# Patient Record
Sex: Female | Born: 1955
Health system: Southern US, Community
[De-identification: ages and names within clinical notes are randomized; demographics above are authoritative.]

## PROBLEM LIST (undated history)

## (undated) DIAGNOSIS — M1990 Unspecified osteoarthritis, unspecified site: Secondary | ICD-10-CM

## (undated) DIAGNOSIS — I872 Venous insufficiency (chronic) (peripheral): Secondary | ICD-10-CM

## (undated) DIAGNOSIS — R42 Dizziness and giddiness: Secondary | ICD-10-CM

## (undated) DIAGNOSIS — J40 Bronchitis, not specified as acute or chronic: Secondary | ICD-10-CM

## (undated) DIAGNOSIS — E785 Hyperlipidemia, unspecified: Secondary | ICD-10-CM

## (undated) DIAGNOSIS — I10 Essential (primary) hypertension: Secondary | ICD-10-CM

## (undated) HISTORY — DX: Venous insufficiency (chronic) (peripheral): I87.2

## (undated) HISTORY — PX: CATARACT EXTRACTION: SUR2

## (undated) HISTORY — DX: Hyperlipidemia, unspecified: E78.5

## (undated) HISTORY — DX: Dizziness and giddiness: R42

## (undated) HISTORY — PX: COLONOSCOPY: SHX174

---

## 1979-08-01 HISTORY — PX: TUBAL LIGATION: SHX77

## 1985-07-31 HISTORY — PX: PARTIAL HYSTERECTOMY: SHX80

## 1999-12-04 ENCOUNTER — Encounter: Payer: Self-pay | Admitting: Emergency Medicine

## 1999-12-04 ENCOUNTER — Emergency Department (HOSPITAL_COMMUNITY): Admission: EM | Admit: 1999-12-04 | Discharge: 1999-12-04 | Payer: Self-pay | Admitting: Emergency Medicine

## 2000-06-06 ENCOUNTER — Encounter: Payer: Self-pay | Admitting: Obstetrics and Gynecology

## 2000-06-06 ENCOUNTER — Encounter: Admission: RE | Admit: 2000-06-06 | Discharge: 2000-06-06 | Payer: Self-pay | Admitting: Obstetrics and Gynecology

## 2000-06-08 ENCOUNTER — Encounter: Payer: Self-pay | Admitting: Obstetrics and Gynecology

## 2000-06-08 ENCOUNTER — Encounter: Admission: RE | Admit: 2000-06-08 | Discharge: 2000-06-08 | Payer: Self-pay | Admitting: Obstetrics and Gynecology

## 2001-06-03 ENCOUNTER — Other Ambulatory Visit: Admission: RE | Admit: 2001-06-03 | Discharge: 2001-06-03 | Payer: Self-pay | Admitting: Obstetrics and Gynecology

## 2001-06-17 ENCOUNTER — Encounter: Payer: Self-pay | Admitting: Obstetrics and Gynecology

## 2001-06-17 ENCOUNTER — Encounter: Admission: RE | Admit: 2001-06-17 | Discharge: 2001-06-17 | Payer: Self-pay | Admitting: Obstetrics and Gynecology

## 2002-06-12 ENCOUNTER — Other Ambulatory Visit: Admission: RE | Admit: 2002-06-12 | Discharge: 2002-06-12 | Payer: Self-pay | Admitting: Obstetrics and Gynecology

## 2002-06-18 ENCOUNTER — Encounter: Admission: RE | Admit: 2002-06-18 | Discharge: 2002-06-18 | Payer: Self-pay | Admitting: Obstetrics and Gynecology

## 2002-06-18 ENCOUNTER — Encounter: Payer: Self-pay | Admitting: Obstetrics and Gynecology

## 2003-06-22 ENCOUNTER — Encounter: Admission: RE | Admit: 2003-06-22 | Discharge: 2003-06-22 | Payer: Self-pay | Admitting: Obstetrics and Gynecology

## 2004-01-05 ENCOUNTER — Emergency Department (HOSPITAL_COMMUNITY): Admission: EM | Admit: 2004-01-05 | Discharge: 2004-01-05 | Payer: Self-pay | Admitting: Emergency Medicine

## 2004-07-01 ENCOUNTER — Other Ambulatory Visit: Admission: RE | Admit: 2004-07-01 | Discharge: 2004-07-01 | Payer: Self-pay | Admitting: Obstetrics and Gynecology

## 2004-07-01 ENCOUNTER — Encounter: Admission: RE | Admit: 2004-07-01 | Discharge: 2004-07-01 | Payer: Self-pay | Admitting: Obstetrics and Gynecology

## 2005-07-03 ENCOUNTER — Encounter: Admission: RE | Admit: 2005-07-03 | Discharge: 2005-07-03 | Payer: Self-pay | Admitting: Obstetrics and Gynecology

## 2005-07-19 ENCOUNTER — Encounter: Admission: RE | Admit: 2005-07-19 | Discharge: 2005-07-19 | Payer: Self-pay | Admitting: Obstetrics and Gynecology

## 2005-11-28 IMAGING — CR DG ANKLE COMPLETE 3+V*R*
3 series · 3 of 3 positions shown · non-contrast
Comparison: none

CLINICAL DATA: Injury with pain.

[view not recorded (1 of 3)]
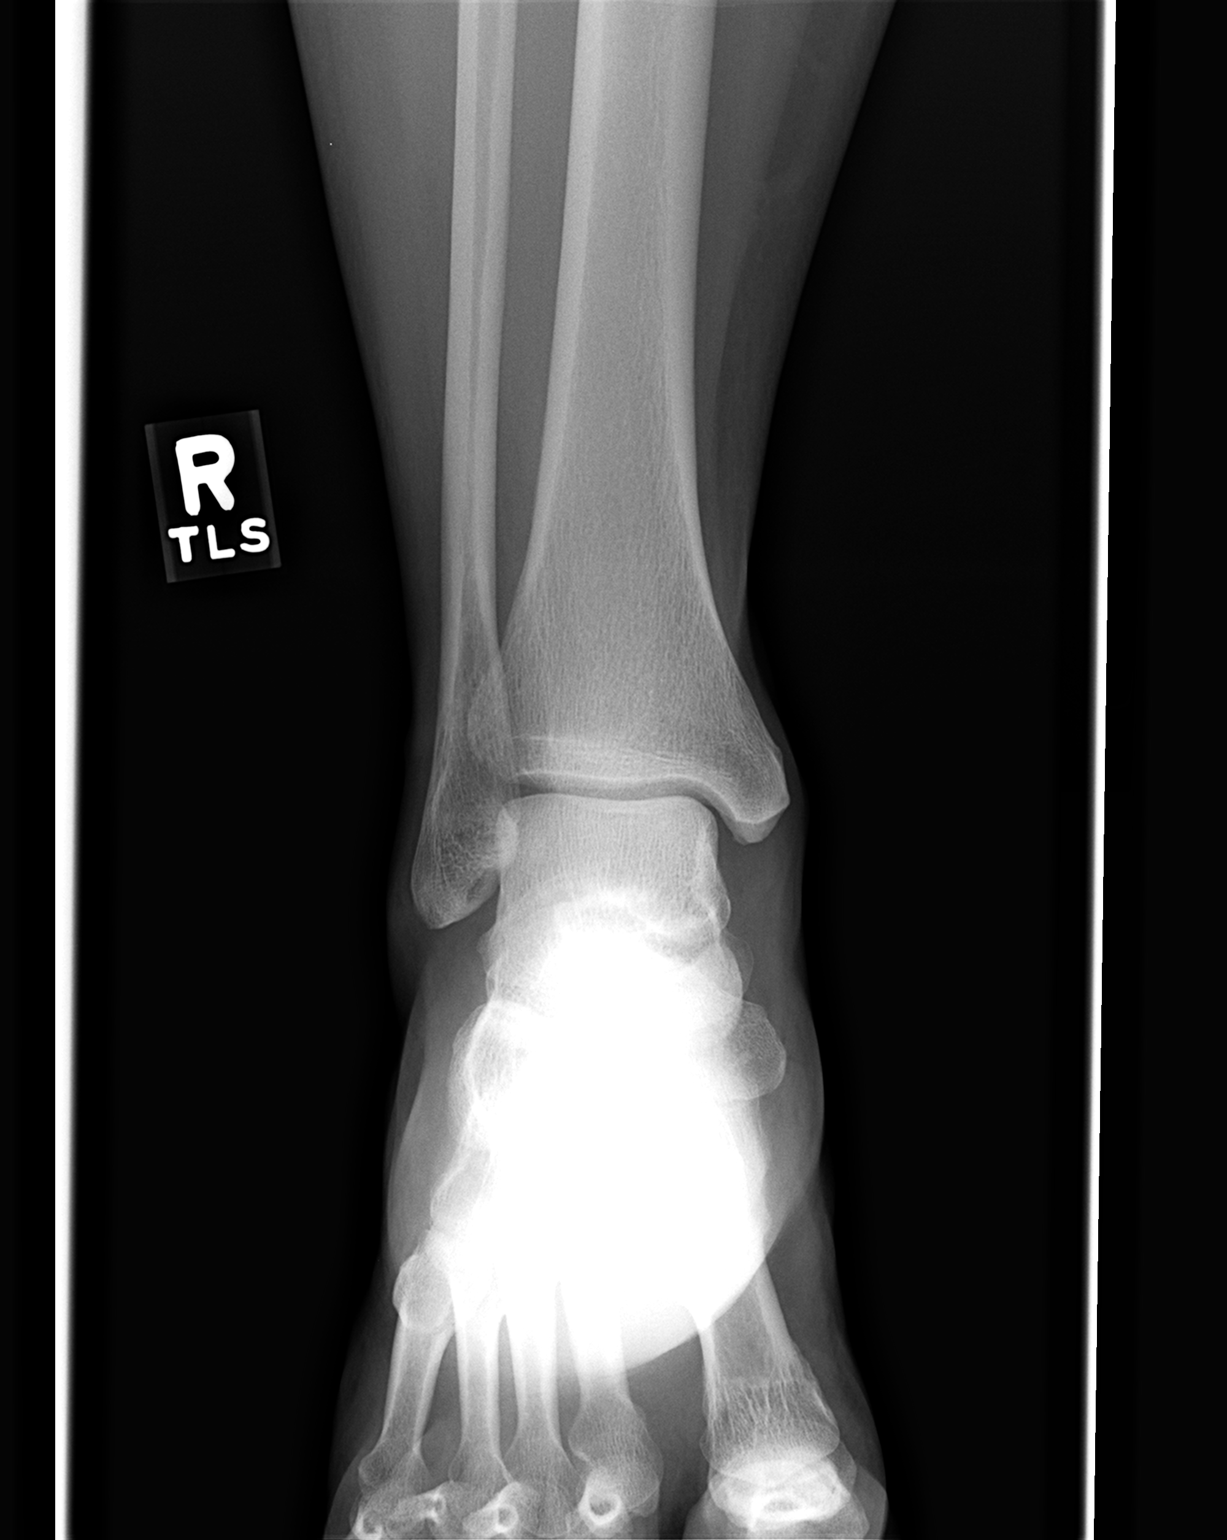

[view not recorded (2 of 3)]
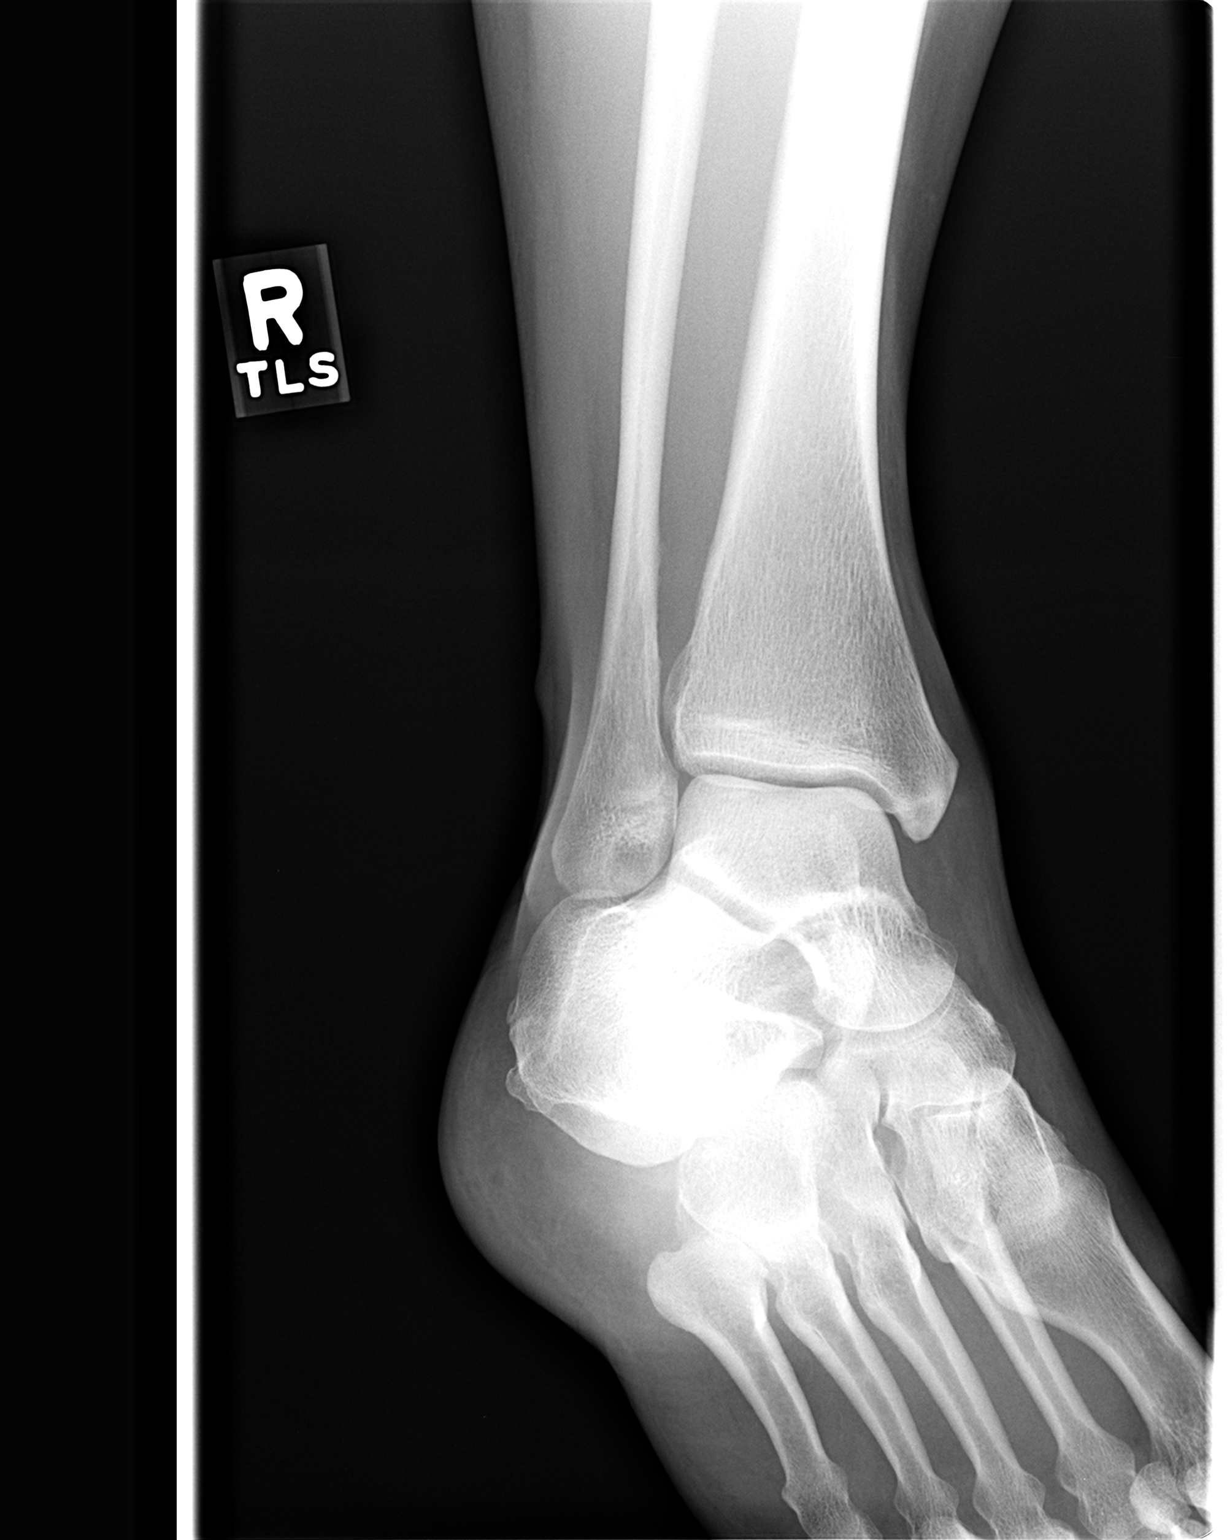

[view not recorded (3 of 3)]
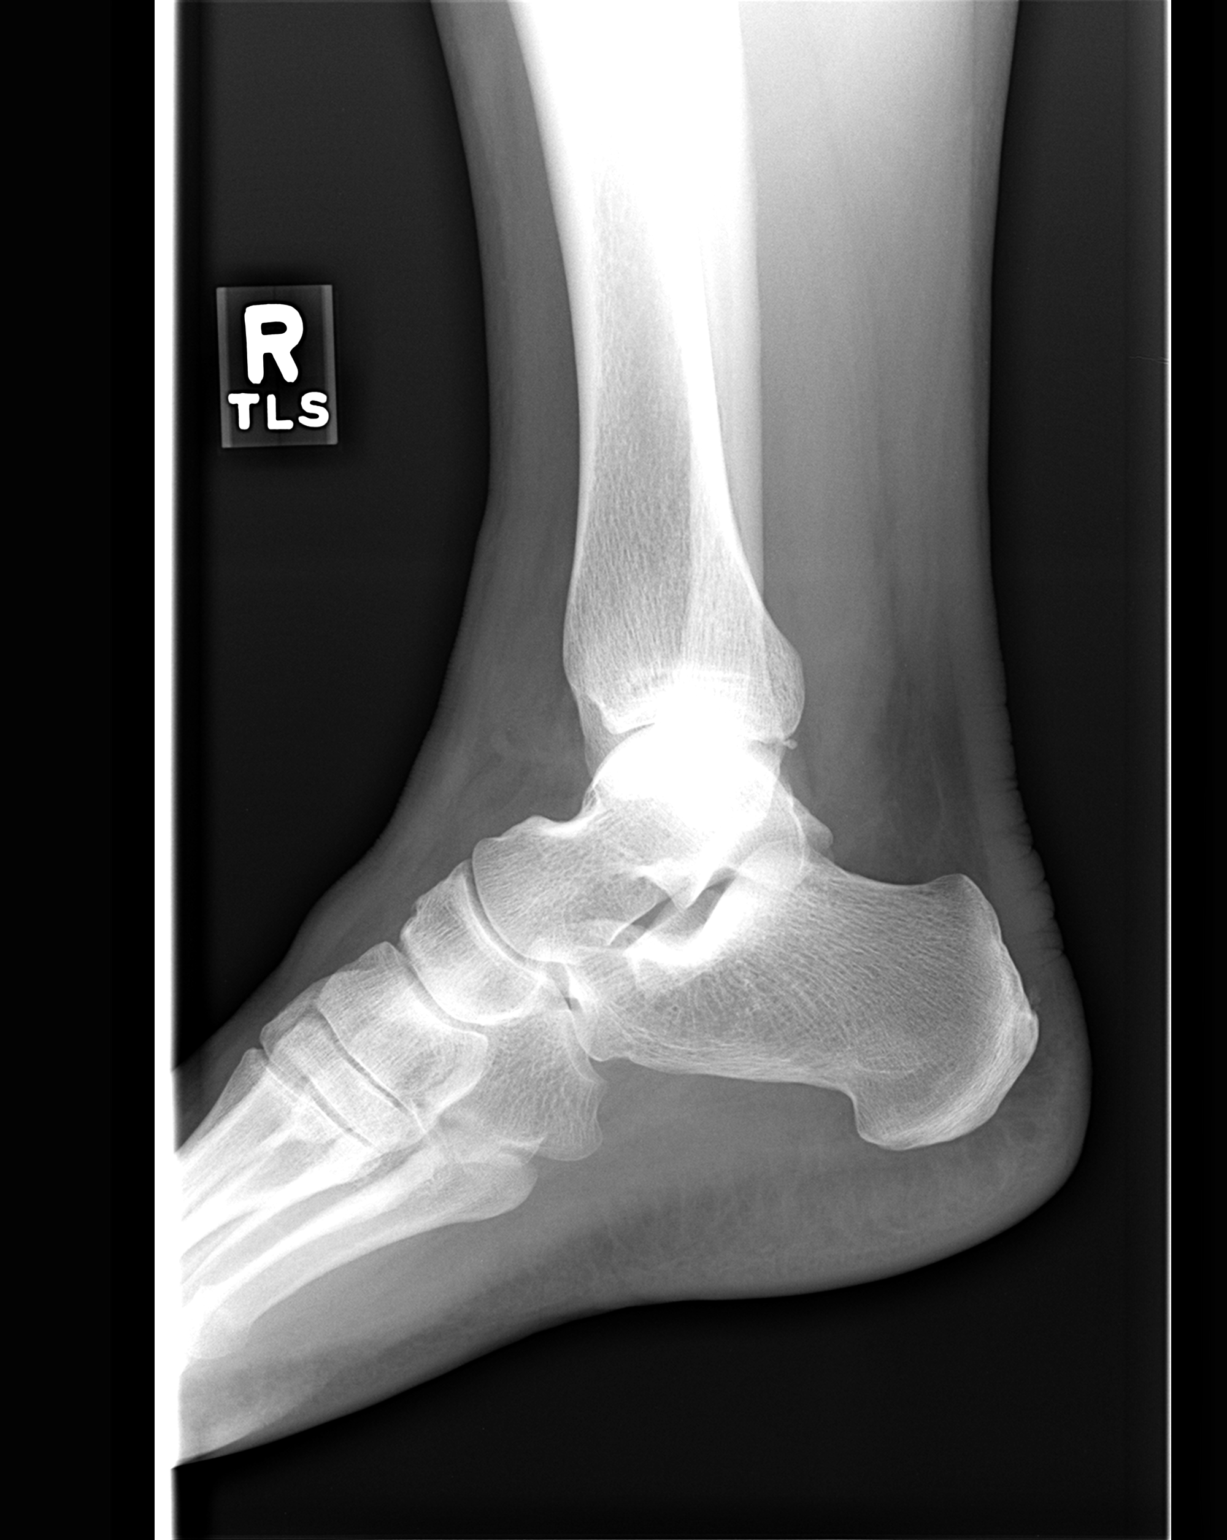

[3 of 3 positions shown; findings below may reference images not displayed]

TWO VIEW RIGHT FOOT, 01/05/04 
 There is no evidence for fracture or dislocation.  No other significant bone or soft tissue abnormalities are identified.  The joints spaces are within normal limits. 

 IMPRESSION
 Normal study.

 THREE VIEW RIGHT ANKLE, 01/05/04
 There is no evidence of fracture or dislocation.  No other significant bone or soft tissue abnormalities are identified. 

 IMPRESSION
 Normal study. 

 [REDACTED]

## 2005-11-28 IMAGING — CR DG FOOT COMPLETE 3+V*R*
1 series · 1 of 1 positions shown · non-contrast
Comparison: none

CLINICAL DATA: Injury with pain.

[view not recorded]
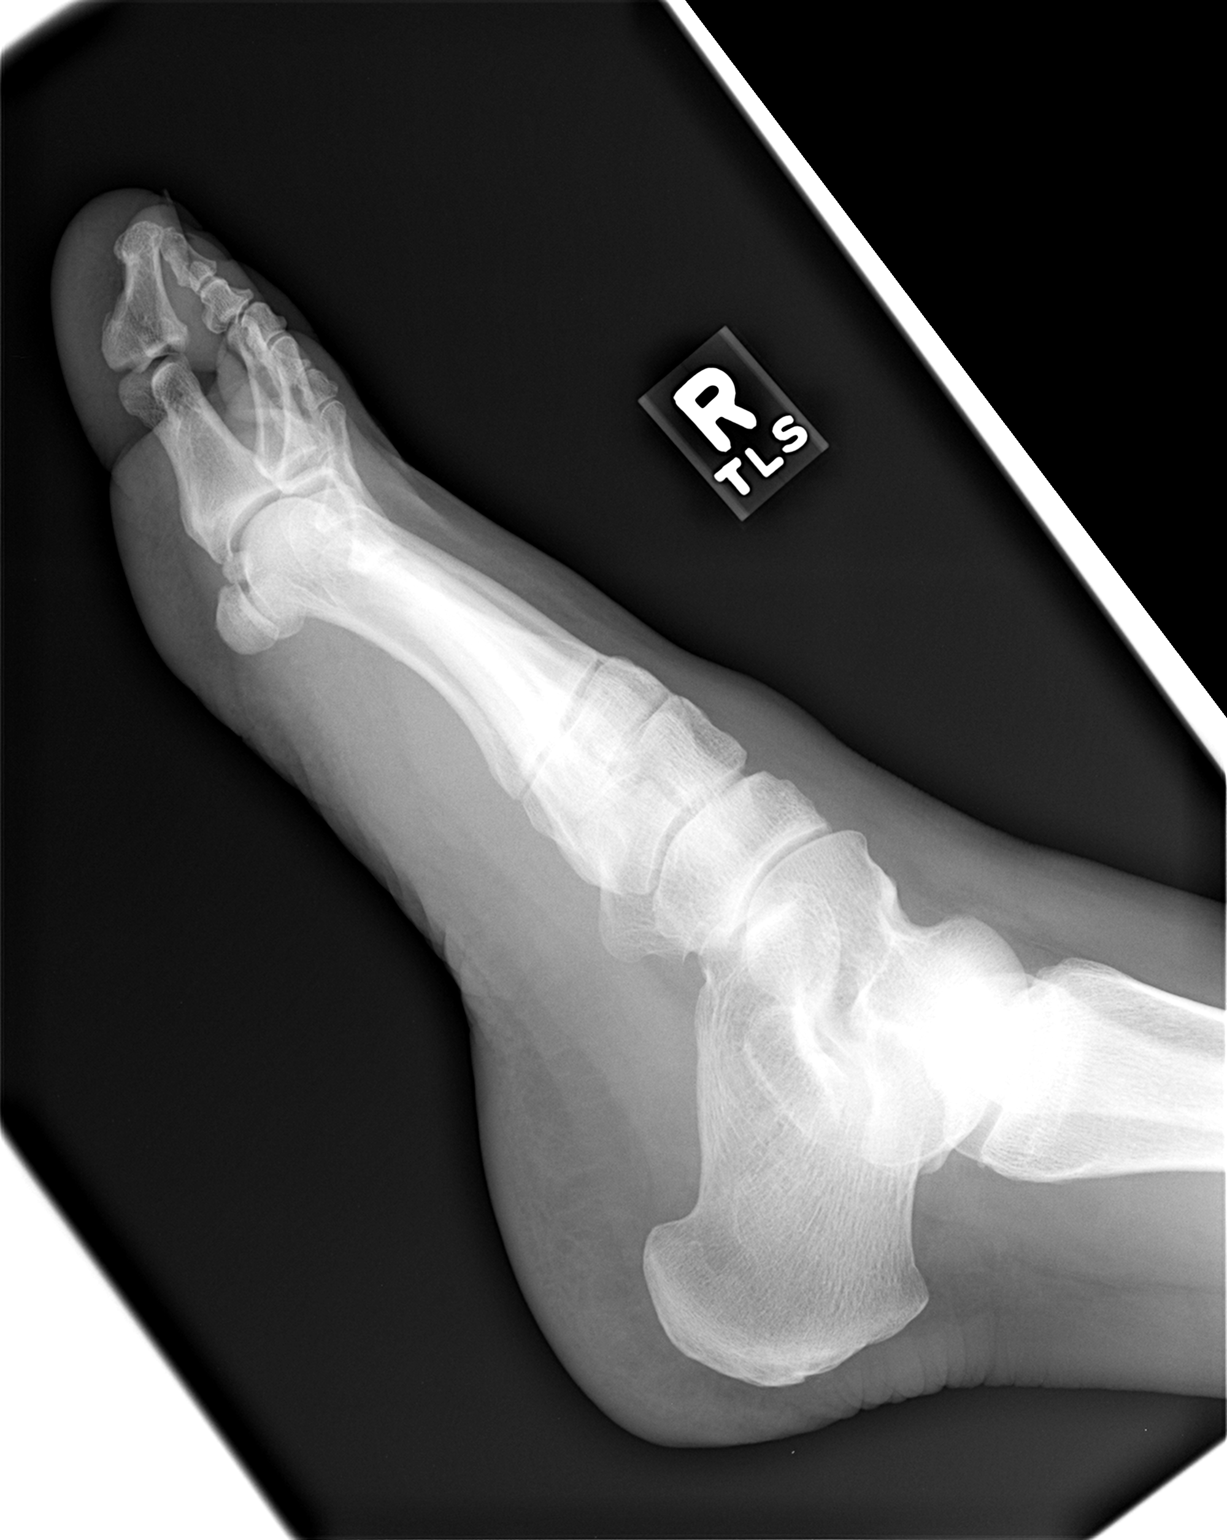

[1 of 1 positions shown; findings below may reference images not displayed]

TWO VIEW RIGHT FOOT, 01/05/04 
 There is no evidence for fracture or dislocation.  No other significant bone or soft tissue abnormalities are identified.  The joints spaces are within normal limits. 

 IMPRESSION
 Normal study.

 THREE VIEW RIGHT ANKLE, 01/05/04
 There is no evidence of fracture or dislocation.  No other significant bone or soft tissue abnormalities are identified. 

 IMPRESSION
 Normal study. 

 [REDACTED]

## 2006-07-18 ENCOUNTER — Encounter: Admission: RE | Admit: 2006-07-18 | Discharge: 2006-07-18 | Payer: Self-pay | Admitting: Obstetrics and Gynecology

## 2006-10-09 ENCOUNTER — Ambulatory Visit: Payer: Self-pay | Admitting: Gastroenterology

## 2006-10-23 ENCOUNTER — Ambulatory Visit: Payer: Self-pay | Admitting: Gastroenterology

## 2007-07-18 ENCOUNTER — Encounter: Admission: RE | Admit: 2007-07-18 | Discharge: 2007-07-18 | Payer: Self-pay | Admitting: Obstetrics and Gynecology

## 2008-06-30 ENCOUNTER — Encounter: Admission: RE | Admit: 2008-06-30 | Discharge: 2008-06-30 | Payer: Self-pay | Admitting: Obstetrics and Gynecology

## 2009-07-16 ENCOUNTER — Encounter: Admission: RE | Admit: 2009-07-16 | Discharge: 2009-07-16 | Payer: Self-pay | Admitting: Obstetrics and Gynecology

## 2010-04-22 ENCOUNTER — Encounter: Payer: Self-pay | Admitting: Nurse Practitioner

## 2010-04-25 ENCOUNTER — Encounter: Payer: Self-pay | Admitting: Nurse Practitioner

## 2010-04-27 ENCOUNTER — Encounter: Payer: Self-pay | Admitting: Nurse Practitioner

## 2010-04-27 ENCOUNTER — Encounter: Admission: RE | Admit: 2010-04-27 | Discharge: 2010-04-27 | Payer: Self-pay | Admitting: Internal Medicine

## 2010-05-09 ENCOUNTER — Telehealth: Payer: Self-pay | Admitting: Gastroenterology

## 2010-05-09 ENCOUNTER — Encounter: Payer: Self-pay | Admitting: Nurse Practitioner

## 2010-05-10 ENCOUNTER — Encounter (INDEPENDENT_AMBULATORY_CARE_PROVIDER_SITE_OTHER): Payer: Self-pay | Admitting: *Deleted

## 2010-05-12 ENCOUNTER — Encounter: Payer: Self-pay | Admitting: Gastroenterology

## 2010-05-12 ENCOUNTER — Ambulatory Visit: Payer: Self-pay | Admitting: Internal Medicine

## 2010-05-12 DIAGNOSIS — R1012 Left upper quadrant pain: Secondary | ICD-10-CM | POA: Insufficient documentation

## 2010-05-18 ENCOUNTER — Telehealth: Payer: Self-pay | Admitting: Gastroenterology

## 2010-07-11 ENCOUNTER — Encounter
Admission: RE | Admit: 2010-07-11 | Discharge: 2010-07-11 | Payer: Self-pay | Source: Home / Self Care | Attending: Obstetrics and Gynecology | Admitting: Obstetrics and Gynecology

## 2010-08-20 ENCOUNTER — Encounter: Payer: Self-pay | Admitting: Obstetrics and Gynecology

## 2010-08-30 NOTE — Letter (Signed)
Summary: New Patient letter  [facility]  [address] [place], [place] [address]   Phone: [phone]  Fax: [phone]       05/10/2010 MRN: [id number]  [facility] [address], [place]  [address]  Dear [facility],  Welcome to the Gastroenterology Division at [facility].    You are scheduled to see Willette Cluster, NP  on  05/12/2010  at 8:30 A.M.  on the 3rd floor at [facility] [address]  We ask that you try to arrive at our office 15 minutes prior to your appointment time to allow for check-in.  We would like you to complete the enclosed self-administered evaluation form prior to your visit and bring it with you on the day of your appointment.  We will review it with you.  Also, please bring a complete list of all your medications or, if you prefer, bring the medication bottles and we will list them.  Please bring your insurance card so that we may make a copy of it.  If your insurance requires a referral to see a specialist, please bring your referral form from your primary care physician.  Co-payments are due at the time of your visit and may be paid by cash, check or credit card.     Your office visit will consist of a consult with your physician (includes a physical exam), any laboratory testing he/she may order, scheduling of any necessary diagnostic testing (e.g. x-ray, ultrasound, CT-scan), and scheduling of a procedure (e.g. Endoscopy, Colonoscopy) if required.  Please allow enough time on your schedule to allow for any/all of these possibilities.    If you cannot keep your appointment, please call [phone] to cancel or reschedule prior to your appointment date.  This allows [place] the opportunity to schedule an appointment for another patient in need of care.  If you do not cancel or reschedule by 5 p.m. the business day prior to your appointment date, you will be charged a $50.00 late cancellation/no-show fee.    Thank you for  choosing  Gastroenterology for your medical needs.  We appreciate the opportunity to care for you.  Please visit [place] at our website  to learn more about our practice.                     Sincerely,                                                             The Gastroenterology Division

## 2010-08-30 NOTE — Letter (Signed)
Summary: Phone note/[facility]  Phone note/[facility]   Imported By: Lester [place] 05/25/2010 10:17:16  _____________________________________________________________________  External Attachment:    Type:   Image     Comment:   External Document

## 2010-08-30 NOTE — Letter (Signed)
Summary: EGD Instructions  [facility]  [address] [place], [place] [address]   Phone: [phone]  Fax: [phone]       CARSYN BOSTER    Dec 25, 1955    MRN: [id number]       Procedure Day /Date: 05-24-10     Arrival Time:  2:30 PM      Procedure Time:  3:30 PM     Location of Procedure:                    X     [facility] (4th Floor)   PREPARATION FOR ENDOSCOPY   On 05-24-10 THE DAY OF THE PROCEDURE:  1.   No solid foods, milk or milk products are allowed after midnight the night before your procedure.  2.   Do not drink anything colored red or purple.  Avoid juices with pulp.  No orange juice.  3.  You may drink clear liquids until 1:30 PM , which is 2 hours before your procedure.                                                                                                CLEAR LIQUIDS INCLUDE: Water Jello Ice Popsicles Tea (sugar ok, no milk/cream) Powdered fruit flavored drinks Coffee (sugar ok, no milk/cream) Gatorade Juice: apple, white grape, white cranberry  Lemonade Clear bullion, consomm, broth Carbonated beverages (any kind) Strained chicken noodle soup Hard Candy   MEDICATION INSTRUCTIONS  Unless otherwise instructed, you should take regular prescription medications with a small sip of water as early as possible the morning of your procedure.         OTHER INSTRUCTIONS  You will need a responsible adult at least 55 years of age to accompany you and drive you home.   This person must remain in the waiting room during your procedure.  Wear loose fitting clothing that is easily removed.  Leave jewelry and other valuables at home.  However, you may wish to bring a book to read or an iPod/MP3 player to listen to music as you wait for your procedure to start.  Remove all body piercing jewelry and leave at home.  Total time from sign-in until discharge is approximately 2-3 hours.  You should go home directly after  your procedure and rest.  You can resume normal activities the day after your procedure.  The day of your procedure you should not:   Drive   Make legal decisions   Operate machinery   Drink alcohol   Return to work  You will receive specific instructions about eating, activities and medications before you leave.    The above instructions have been reviewed and explained to me by   _______________________    I fully understand and can verbalize these instructions _____________________________ Date _________

## 2010-08-30 NOTE — Progress Notes (Signed)
Summary: Triage  Phone Note From Other Clinic   Caller: [facility]  [phone] Call For: Dr. Arlyce Dice Summary of Call: Left mid abd pain...CT scan done at [facility]. Requesting pt. be seen in the next week Initial call taken by: Karna Christmas,  May 09, 2010 4:59 PM  Follow-up for Phone Call        Patient  is scheduled to see Willette Cluster RNP 05/12/10 8:30 Follow-up by: Darcey Nora RN, CGRN,  May 10, 2010 9:54 AM

## 2010-08-30 NOTE — Procedures (Signed)
Summary: [facility] COLON   Colonoscopy  Procedure date:  10/23/2006  Findings:      Location:  [facility].    Procedures Next Due Date:    Colonoscopy: 10/2016 Patient Name: Tara Baker, Tara Baker. MRN:  Procedure Procedures: Colonoscopy CPT: [phone].  Personnel: Endoscopist: Barbette Hair. Arlyce Dice, MD.  Patient Consent: Procedure, Alternatives, Risks and Benefits discussed, consent obtained, from patient.  Indications  Average Risk Screening Routine.  History  Current Medications: Patient is not currently taking Coumadin.  Pre-Exam Physical: Performed Oct 23, 2006. Cardio-pulmonary exam, HEENT exam , Abdominal exam, Mental status exam WNL.  Comments: Patient history reviewed/updated, physical performed prior to initiation of sedation?yes Exam Exam: Extent of exam reached: Cecum, extent intended: Cecum.  The cecum was identified by appendiceal orifice and IC valve. Time to Cecum: 00:03: 45. Time for Withdrawl: 00:03:56. Colon retroflexion performed. ASA Classification: I. Tolerance: good.  Monitoring: Pulse and BP monitoring, Oximetry used. Supplemental O2 given. at 2 Liters.  Colon Prep Used Miralax for colon prep. Prep results: fair, adequate exam.  Sedation Meds: Patient assessed and found to be appropriate for moderate (conscious) sedation. Sedation was managed by the Endoscopist. Fentanyl 75 mcg. given IV. Versed 8 mg. given IV.  Findings - NORMAL EXAM: Cecum to Rectum.  NORMAL EXAM: Cecum.   Assessment Normal examination.  Events  Unplanned Interventions: No intervention was required.  Unplanned Events: There were no complications. Plans  Post Exam Instructions: Post sedation instructions given.  Patient Education: Patient given standard instructions for: a normal exam.  Disposition: After procedure patient sent to recovery. After recovery patient sent home.  Scheduling/Referral: Colonoscopy, to Barbette Hair. Arlyce Dice, MD, around Oct 22, 2016.     cc: Huel Cote, MD  This report was created from the original endoscopy report, which was reviewed and signed by the above listed endoscopist.

## 2010-08-30 NOTE — Progress Notes (Signed)
Summary: EGD ?'s  Phone Note Call from Patient Call back at Work Phone [phone]   Caller: Patient Call For: Dr. Arlyce Dice Reason for Call: Talk to Nurse Summary of Call: EGD ?'s Initial call taken by: Vallarie Mare,  May 18, 2010 9:43 AM  Follow-up for Phone Call        The pt called to report she is not having any issues like she was having when she was here and saw Willette Cluster RNP.  She is feeling much better not having the LUQ pain now and she wants to cancel the Endoscopy. I did mention the problem she was having could reoccur bur she still wants to cancel the procedure.  Follow-up by: Joselyn Glassman,  May 18, 2010 10:10 AM  Additional Follow-up for Phone Call Additional follow up Details #1::        Okay, good, maybe the Flexeril helped and it was musculoskeletal pain.  She should come back / call if recurrent pain Additional Follow-up by: Willette Cluster NP,  May 19, 2010 4:30 PM

## 2010-08-30 NOTE — Letter (Signed)
Summary: [facility]  [facility]   Imported By: Lester [place] 05/25/2010 10:14:33  _____________________________________________________________________  External Attachment:    Type:   Image     Comment:   External Document

## 2010-08-30 NOTE — Assessment & Plan Note (Signed)
Summary: left sided abdominal pain/sheri   History of Present Illness Visit Type: consult Primary GI MD: Melvia Heaps MD [facility] Primary Fumio Vandam: Jarome Matin, MD Chief Complaint: Patient c/o several weeks LLQ abdominal pain which can be sharp in nature. She also has alternating constipation and diarrhea. History of Present Illness:   Patient had screening colonoscopy March 2008 (last time seen here, > 3 yrs ago) with Dr. Arlyce Dice a few years ago. She has been referred here for evaluation of abdominal pain and gives a several day history of non-radiating LUQ pain, intermittent nausea. Pain is constant, nagging with occasional sharpness. It is exacerbated by twisting and bending. No associated nausea. Dr. Jacky Kindle stopped her Meloxicam, started her on a PPI which she took for a few days then discontinued it because symptoms didn't improve. No unusual exercises / activities lately. Has "pinched nerve" in lower back so careful not to lift too much. Has intermittent pain over right buttock and down right leg. CTscan  Chronic problems with alternating constipation (hard stool) / loose stool. She has had this for years. No black stools.      GI Review of Systems    Reports abdominal pain and  nausea.     Location of  Abdominal pain: LUQ.    Denies acid reflux, belching, bloating, chest pain, dysphagia with liquids, dysphagia with solids, heartburn, loss of appetite, vomiting, vomiting blood, weight loss, and  weight gain.      Reports change in bowel habits, constipation, and  diarrhea.     Denies anal fissure, black tarry stools, diverticulosis, fecal incontinence, heme positive stool, hemorrhoids, irritable bowel syndrome, jaundice, light color stool, liver problems, rectal bleeding, and  rectal pain. Preventive Screening-Counseling & Management  Alcohol-Tobacco     Smoking Status: quit  Caffeine-Diet-Exercise     Does Patient Exercise: no      Drug Use:  no.      Current Medications  (verified): 1)  Aspirin 81 Mg Tbec (Aspirin) .... Take 1 Tablet By Mouth Once A Day 2)  Meloxicam 7.5 Mg Tabs (Meloxicam) .... Take 1 Tablet By Mouth Once A Day 3)  Lisinopril 10 Mg Tabs (Lisinopril) .... Take 1 Tablet By Mouth Once A Day 4)  Hydrocodone-Acetaminophen 5-325 Mg Tabs (Hydrocodone-Acetaminophen) .... Take 1 Tablet By Mouth As Needed For Back Pain  Allergies (verified): No Known Drug Allergies  Past History:  Past Medical History: Headaches Hypertension  Past Surgical History: Hysterectomy Tubal Ligation  Family History: Family History of Breast Cancer: Maternal Aunt No FH of Colon Cancer: Family History of Pancreatic Cancer: Maternal Grandfather Family History of Diabetes: Maternal Grandmother  Social History: Patient is a former smoker. -stopped 14 years ago Alcohol Use - yes-very rare2-6 cups daily Daily Caffeine Use- Illicit Drug Use - no Patient does not get regular exercise.  Smoking Status:  quit Drug Use:  no Does Patient Exercise:  no  Review of Systems       The patient complains of back pain and headaches-new.  The patient denies allergy/sinus, anemia, anxiety-new, arthritis/joint pain, blood in urine, breast changes/lumps, change in vision, confusion, cough, coughing up blood, depression-new, fainting, fatigue, fever, hearing problems, heart murmur, heart rhythm changes, itching, menstrual pain, muscle pains/cramps, night sweats, nosebleeds, pregnancy symptoms, shortness of breath, skin rash, sleeping problems, sore throat, swelling of feet/legs, swollen lymph glands, thirst - excessive , urination - excessive , urination changes/pain, urine leakage, vision changes, and voice change.    Vital Signs:  Patient profile:   55  year old female Height:      65 inches Weight:      183.38 pounds BMI:     30.63 BSA:     1.91 Pulse rate:   88 / minute Pulse rhythm:   regular BP sitting:   140 / 80  (left arm)  Vitals Entered By: Lamona Curl CMA  Duncan Dull) (May 12, 2010 8:42 AM)  Physical Exam  General:  Well developed, well nourished, no acute distress. Head:  Normocephalic and atraumatic. Eyes:  Conjunctiva pink, no icterus.  Mouth:  No oral lesions. Tongue moist.  Neck:  Supple; no masses Lungs:  Clear throughout to auscultation. Heart:  Regular rate and rhythm; no murmurs, rubs,  or bruits. Abdomen:  .Abdomen soft, nondistended. Mild to moderate LUQ tenderness to palpation. Cannot reproduce pain by engaging upper abdominal muscles. No obvious masses or hepatomegaly.Normal bowel sounds. Rectal:  No external or internal lesions. Light brown, heme negative stool.  Msk:  Symmetrical with no gross deformities. Normal posture. Extremities:  No palmar erythema, no edema.  Neurologic:  Alert and  oriented x4;  grossly normal neurologically. Skin:  Intact without significant lesions or rashes. Cervical Nodes:  No significant cervical adenopathy. Psych:  Alert and cooperative. Normal mood and affect.   Impression & Recommendations:  Problem # 1:  LUQ PAIN (ICD-789.02) Assessment New Several day history of LUQ pain, intermittent nausea. CTscan with contrast negative except for fatty liver, fat containing ventral hernia in RLQ. A soft tissue density within the descending duodenum was seen but felt to be due to underdistension. Patient was on Meloxicam so PUD is possibility. Musculoskeletal pain is possible. We discussed upper endoscopy for further evaluation though she is a little anxious about having a procedure. Will try low dose Flexeril and heat to area for the next 3 or so days. Restart PPI and continue to hold Meloxicam. The patient will be scheduled for an EGD with biopsies (if indicated).  The risks and benefits of the procedure, as well as alternatives were discussed with the patient and she agrees to proceed. Obviously, if patient's symptoms resolve we can hold off on upper endoscopy.   Other Orders: EGD (EGD)  Patient  Instructions: 1)  Use the heating pad on the left upper abd area. 2)  Continue the PPI medication.  3)  We scheduled the Endoscopy for 05-24-10. 4)  Directions and brochure provided. 5)  [facility] Patient Information Guide given to patient. 6)  Call Pam on Tues of next week with an update.  Call [phone]. 7)  Copy sent to : [facility] Prescriptions: FLEXERIL 5 MG TABS (CYCLOBENZAPRINE HCL) Take 1 tab before bedtime Thurs and Fri,  then take 1 tab twice daily..  Will make you sleepy.  #30 x 0   Entered by:   Lowry Ram NCMA   Authorized by:   Willette Cluster NP   Signed by:   Lowry Ram NCMA on 05/12/2010   Method used:   Electronically to        [facility] [address] (retail)       [phone]       [place], [place]  [address]       Ph: [phone]       Fax: [phone]   RxID:   [id number]

## 2010-08-30 NOTE — Letter (Signed)
Summary: [facility]  [facility]   Imported By: Lester  05/25/2010 10:15:54  _____________________________________________________________________  External Attachment:    Type:   Image     Comment:   External Document

## 2011-05-11 ENCOUNTER — Other Ambulatory Visit: Payer: Self-pay | Admitting: Obstetrics and Gynecology

## 2011-05-11 DIAGNOSIS — Z1231 Encounter for screening mammogram for malignant neoplasm of breast: Secondary | ICD-10-CM

## 2011-07-13 ENCOUNTER — Ambulatory Visit
Admission: RE | Admit: 2011-07-13 | Discharge: 2011-07-13 | Disposition: A | Discharge status: Home / Self Care | Payer: 59 | Source: Ambulatory Visit | Attending: Obstetrics and Gynecology | Admitting: Obstetrics and Gynecology

## 2011-07-13 DIAGNOSIS — Z1231 Encounter for screening mammogram for malignant neoplasm of breast: Secondary | ICD-10-CM

## 2012-05-01 ENCOUNTER — Other Ambulatory Visit: Payer: Self-pay | Admitting: Obstetrics and Gynecology

## 2012-05-01 DIAGNOSIS — Z1231 Encounter for screening mammogram for malignant neoplasm of breast: Secondary | ICD-10-CM

## 2012-07-17 ENCOUNTER — Ambulatory Visit
Admission: RE | Admit: 2012-07-17 | Discharge: 2012-07-17 | Disposition: A | Discharge status: Home / Self Care | Payer: 59 | Source: Ambulatory Visit | Attending: Obstetrics and Gynecology | Admitting: Obstetrics and Gynecology

## 2012-07-17 DIAGNOSIS — Z1231 Encounter for screening mammogram for malignant neoplasm of breast: Secondary | ICD-10-CM

## 2013-05-07 ENCOUNTER — Other Ambulatory Visit: Payer: Self-pay

## 2013-05-07 DIAGNOSIS — Z1231 Encounter for screening mammogram for malignant neoplasm of breast: Secondary | ICD-10-CM

## 2013-05-08 ENCOUNTER — Other Ambulatory Visit: Payer: Self-pay | Admitting: Obstetrics and Gynecology

## 2013-05-16 ENCOUNTER — Other Ambulatory Visit: Payer: Self-pay | Admitting: Obstetrics and Gynecology

## 2013-05-16 DIAGNOSIS — Z780 Asymptomatic menopausal state: Secondary | ICD-10-CM

## 2013-06-24 ENCOUNTER — Other Ambulatory Visit: Payer: Self-pay | Admitting: Obstetrics and Gynecology

## 2013-07-01 ENCOUNTER — Ambulatory Visit
Admission: RE | Admit: 2013-07-01 | Discharge: 2013-07-01 | Disposition: A | Discharge status: Home / Self Care | Payer: 59 | Source: Ambulatory Visit | Attending: Obstetrics and Gynecology | Admitting: Obstetrics and Gynecology

## 2013-07-01 ENCOUNTER — Ambulatory Visit
Admission: RE | Admit: 2013-07-01 | Discharge: 2013-07-01 | Disposition: A | Discharge status: Home / Self Care | Payer: 59 | Source: Ambulatory Visit

## 2013-07-01 DIAGNOSIS — Z780 Asymptomatic menopausal state: Secondary | ICD-10-CM

## 2013-07-01 DIAGNOSIS — Z1231 Encounter for screening mammogram for malignant neoplasm of breast: Secondary | ICD-10-CM

## 2014-03-16 ENCOUNTER — Encounter (HOSPITAL_COMMUNITY): Payer: Self-pay | Admitting: Emergency Medicine

## 2014-03-16 ENCOUNTER — Emergency Department (HOSPITAL_COMMUNITY): Payer: 59

## 2014-03-16 ENCOUNTER — Emergency Department (HOSPITAL_COMMUNITY)
Admission: EM | Admit: 2014-03-16 | Discharge: 2014-03-16 | Disposition: A | Discharge status: Home / Self Care | Payer: 59 | Attending: Emergency Medicine | Admitting: Emergency Medicine

## 2014-03-16 DIAGNOSIS — Z7982 Long term (current) use of aspirin: Secondary | ICD-10-CM | POA: Insufficient documentation

## 2014-03-16 DIAGNOSIS — Z79899 Other long term (current) drug therapy: Secondary | ICD-10-CM | POA: Insufficient documentation

## 2014-03-16 DIAGNOSIS — Y9389 Activity, other specified: Secondary | ICD-10-CM | POA: Diagnosis not present

## 2014-03-16 DIAGNOSIS — S0990XA Unspecified injury of head, initial encounter: Secondary | ICD-10-CM | POA: Diagnosis not present

## 2014-03-16 DIAGNOSIS — S0993XA Unspecified injury of face, initial encounter: Secondary | ICD-10-CM | POA: Diagnosis present

## 2014-03-16 DIAGNOSIS — Y92410 Unspecified street and highway as the place of occurrence of the external cause: Secondary | ICD-10-CM | POA: Diagnosis not present

## 2014-03-16 DIAGNOSIS — I10 Essential (primary) hypertension: Secondary | ICD-10-CM | POA: Insufficient documentation

## 2014-03-16 DIAGNOSIS — S199XXA Unspecified injury of neck, initial encounter: Principal | ICD-10-CM

## 2014-03-16 HISTORY — DX: Essential (primary) hypertension: I10

## 2014-03-16 MED ORDER — OXYCODONE-ACETAMINOPHEN 5-325 MG PO TABS: 1.0000 | ORAL_TABLET | ORAL | Status: DC | PRN

## 2014-03-16 MED ORDER — OXYCODONE-ACETAMINOPHEN 5-325 MG PO TABS
1.0000 | ORAL_TABLET | Freq: Once | ORAL | Status: AC
  Administered 2014-03-16: 1 via ORAL
  Filled 2014-03-16: qty 1

## 2014-03-16 MED ORDER — METHOCARBAMOL 500 MG PO TABS: 500.0000 mg | ORAL_TABLET | Freq: Two times a day (BID) | ORAL | Status: DC
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
## 2014-03-18 NOTE — ED Provider Notes (Signed)
Medical screening examination/treatment/procedure(s) were performed by non-physician practitioner and as supervising physician I was immediately available for consultation/collaboration.   EKG Interpretation None        Pamella Pert, MD 03/18/14 1149

## 2014-08-12 ENCOUNTER — Encounter: Payer: Self-pay | Admitting: Gastroenterology

## 2016-07-12 DIAGNOSIS — Z87448 Personal history of other diseases of urinary system: Secondary | ICD-10-CM | POA: Insufficient documentation

## 2016-09-04 ENCOUNTER — Encounter: Payer: Self-pay | Admitting: Gastroenterology

## 2017-01-28 DIAGNOSIS — H524 Presbyopia: Secondary | ICD-10-CM | POA: Diagnosis not present

## 2017-02-06 DIAGNOSIS — I10 Essential (primary) hypertension: Secondary | ICD-10-CM | POA: Diagnosis not present

## 2017-02-06 DIAGNOSIS — Z780 Asymptomatic menopausal state: Secondary | ICD-10-CM | POA: Diagnosis not present

## 2017-02-06 DIAGNOSIS — Z0000 Encounter for general adult medical examination without abnormal findings: Secondary | ICD-10-CM | POA: Diagnosis not present

## 2017-02-09 DIAGNOSIS — Z1212 Encounter for screening for malignant neoplasm of rectum: Secondary | ICD-10-CM | POA: Diagnosis not present

## 2017-02-12 DIAGNOSIS — I10 Essential (primary) hypertension: Secondary | ICD-10-CM | POA: Diagnosis not present

## 2017-02-12 DIAGNOSIS — Z1389 Encounter for screening for other disorder: Secondary | ICD-10-CM | POA: Diagnosis not present

## 2017-02-12 DIAGNOSIS — R040 Epistaxis: Secondary | ICD-10-CM | POA: Diagnosis not present

## 2017-02-12 DIAGNOSIS — E668 Other obesity: Secondary | ICD-10-CM | POA: Diagnosis not present

## 2017-02-12 DIAGNOSIS — Z0000 Encounter for general adult medical examination without abnormal findings: Secondary | ICD-10-CM | POA: Diagnosis not present

## 2017-02-12 DIAGNOSIS — G4709 Other insomnia: Secondary | ICD-10-CM | POA: Diagnosis not present

## 2017-04-09 DIAGNOSIS — I10 Essential (primary) hypertension: Secondary | ICD-10-CM | POA: Diagnosis not present

## 2017-04-09 DIAGNOSIS — E668 Other obesity: Secondary | ICD-10-CM | POA: Diagnosis not present

## 2017-04-09 DIAGNOSIS — E784 Other hyperlipidemia: Secondary | ICD-10-CM | POA: Diagnosis not present

## 2017-04-09 DIAGNOSIS — Z23 Encounter for immunization: Secondary | ICD-10-CM | POA: Diagnosis not present

## 2017-05-09 DIAGNOSIS — J209 Acute bronchitis, unspecified: Secondary | ICD-10-CM | POA: Diagnosis not present

## 2017-05-09 DIAGNOSIS — R05 Cough: Secondary | ICD-10-CM | POA: Diagnosis not present

## 2017-06-04 DIAGNOSIS — E7849 Other hyperlipidemia: Secondary | ICD-10-CM | POA: Diagnosis not present

## 2017-07-12 DIAGNOSIS — J310 Chronic rhinitis: Secondary | ICD-10-CM | POA: Diagnosis not present

## 2017-07-12 DIAGNOSIS — I10 Essential (primary) hypertension: Secondary | ICD-10-CM | POA: Diagnosis not present

## 2017-07-12 DIAGNOSIS — Z6830 Body mass index (BMI) 30.0-30.9, adult: Secondary | ICD-10-CM | POA: Diagnosis not present

## 2017-07-13 ENCOUNTER — Encounter: Payer: Self-pay | Admitting: Gastroenterology

## 2017-07-13 DIAGNOSIS — N951 Menopausal and female climacteric states: Secondary | ICD-10-CM | POA: Diagnosis not present

## 2017-07-13 DIAGNOSIS — Z1389 Encounter for screening for other disorder: Secondary | ICD-10-CM | POA: Diagnosis not present

## 2017-07-13 DIAGNOSIS — Z1231 Encounter for screening mammogram for malignant neoplasm of breast: Secondary | ICD-10-CM | POA: Diagnosis not present

## 2017-07-13 DIAGNOSIS — Z6828 Body mass index (BMI) 28.0-28.9, adult: Secondary | ICD-10-CM | POA: Diagnosis not present

## 2017-07-13 DIAGNOSIS — Z01419 Encounter for gynecological examination (general) (routine) without abnormal findings: Secondary | ICD-10-CM | POA: Diagnosis not present

## 2017-07-13 DIAGNOSIS — R32 Unspecified urinary incontinence: Secondary | ICD-10-CM | POA: Diagnosis not present

## 2017-07-13 DIAGNOSIS — Z130 Encounter for screening for diseases of the blood and blood-forming organs and certain disorders involving the immune mechanism: Secondary | ICD-10-CM | POA: Diagnosis not present

## 2017-08-07 DIAGNOSIS — Z23 Encounter for immunization: Secondary | ICD-10-CM | POA: Diagnosis not present

## 2017-09-07 ENCOUNTER — Ambulatory Visit (AMBULATORY_SURGERY_CENTER): Payer: Self-pay | Admitting: *Deleted

## 2017-09-07 ENCOUNTER — Other Ambulatory Visit: Payer: Self-pay

## 2017-09-07 ENCOUNTER — Encounter: Payer: Self-pay | Admitting: Gastroenterology

## 2017-09-07 VITALS — Ht 65.0 in | Wt 180.0 lb

## 2017-09-07 DIAGNOSIS — Z1211 Encounter for screening for malignant neoplasm of colon: Secondary | ICD-10-CM

## 2017-09-07 MED ORDER — PEG-KCL-NACL-NASULF-NA ASC-C 140 G PO SOLR: 1.0000 | Freq: Once | ORAL | 0 refills | Status: AC
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
## 2017-09-21 ENCOUNTER — Encounter: Payer: Self-pay | Admitting: Gastroenterology

## 2017-09-21 ENCOUNTER — Other Ambulatory Visit: Payer: Self-pay

## 2017-09-21 ENCOUNTER — Ambulatory Visit (AMBULATORY_SURGERY_CENTER): Payer: BLUE CROSS/BLUE SHIELD | Admitting: Gastroenterology

## 2017-09-21 VITALS — BP 115/67 | HR 67 | Temp 98.7°F | Resp 14 | Ht 65.0 in | Wt 180.0 lb

## 2017-09-21 DIAGNOSIS — D120 Benign neoplasm of cecum: Secondary | ICD-10-CM

## 2017-09-21 DIAGNOSIS — Z1211 Encounter for screening for malignant neoplasm of colon: Secondary | ICD-10-CM

## 2017-09-21 DIAGNOSIS — Z1212 Encounter for screening for malignant neoplasm of rectum: Secondary | ICD-10-CM | POA: Diagnosis not present

## 2017-09-21 DIAGNOSIS — D122 Benign neoplasm of ascending colon: Secondary | ICD-10-CM

## 2017-09-21 MED ORDER — SODIUM CHLORIDE 0.9 % IV SOLN: 500.0000 mL | Freq: Once | INTRAVENOUS | Status: DC

## 2017-09-21 NOTE — Patient Instructions (Signed)
Information on polyps and diverticulosis given.  YOU HAD AN ENDOSCOPIC PROCEDURE TODAY AT [facility]:   Refer to the procedure report that was given to you for any specific questions about what was found during the examination.  If the procedure report does not answer your questions, please call your gastroenterologist to clarify.  If you requested that your care partner not be given the details of your procedure findings, then the procedure report has been included in a sealed envelope for you to review at your convenience later.  YOU SHOULD EXPECT: Some feelings of bloating in the abdomen. Passage of more gas than usual.  Walking can help get rid of the air that was put into your GI tract during the procedure and reduce the bloating. If you had a lower endoscopy (such as a colonoscopy or flexible sigmoidoscopy) you may notice spotting of blood in your stool or on the toilet paper. If you underwent a bowel prep for your procedure, you may not have a normal bowel movement for a few days.  Please Note:  You might notice some irritation and congestion in your nose or some drainage.  This is from the oxygen used during your procedure.  There is no need for concern and it should clear up in a day or so.  SYMPTOMS TO REPORT IMMEDIATELY:   Following lower endoscopy (colonoscopy or flexible sigmoidoscopy):  Excessive amounts of blood in the stool  Significant tenderness or worsening of abdominal pains  Swelling of the abdomen that is new, acute  Fever of 100F or higher   For urgent or emergent issues, a gastroenterologist can be reached at any hour by calling [phone].   DIET:  We do recommend a small meal at first, but then you may proceed to your regular diet.  Drink plenty of fluids but you should avoid alcoholic beverages for 24 hours.  ACTIVITY:  You should plan to take it easy for the rest of today and you should NOT DRIVE or use heavy machinery until tomorrow (because  of the sedation medicines used during the test).    FOLLOW UP: Our staff will call the number listed on your records the next business day following your procedure to check on you and address any questions or concerns that you may have regarding the information given to you following your procedure. If we do not reach you, we will leave a message.  However, if you are feeling well and you are not experiencing any problems, there is no need to return our call.  We will assume that you have returned to your regular daily activities without incident.  If any biopsies were taken you will be contacted by phone or by letter within the next 1-3 weeks.  Please call us at [phone] if you have not heard about the biopsies in 3 weeks.    SIGNATURES/CONFIDENTIALITY: You and/or your care partner have signed paperwork which will be entered into your electronic medical record.  These signatures attest to the fact that that the information above on your After Visit Summary has been reviewed and is understood.  Full responsibility of the confidentiality of this discharge information lies with you and/or your care-partner.

## 2017-09-21 NOTE — Op Note (Signed)
[place] Patient Name: Tara Baker Procedure Date: 09/21/2017 1:56 PM MRN: [id number] Endoscopist: Remo Lipps P. Calyn Sivils MD, MD Age: 62 Referring MD:  Date of Birth: 1955-12-01 Gender: Female Account #: [id number] Procedure:                Colonoscopy Indications:              Screening for colorectal malignant neoplasm Medicines:                Monitored Anesthesia Care Procedure:                Pre-Anesthesia Assessment:                           - Prior to the procedure, a History and Physical                            was performed, and patient medications and                            allergies were reviewed. The patient's tolerance of                            previous anesthesia was also reviewed. The risks                            and benefits of the procedure and the sedation                            options and risks were discussed with the patient.                            All questions were answered, and informed consent                            was obtained. Prior Anticoagulants: The patient has                            taken no previous anticoagulant or antiplatelet                            agents. ASA Grade Assessment: II - A patient with                            mild systemic disease. After reviewing the risks                            and benefits, the patient was deemed in                            satisfactory condition to undergo the procedure.                           After obtaining informed consent, the colonoscope  was passed under direct vision. Throughout the                            procedure, the patient's blood pressure, pulse, and                            oxygen saturations were monitored continuously. The                            Colonoscope was introduced through the anus and                            advanced to the the cecum, identified by                            appendiceal orifice  and ileocecal valve. The                            colonoscopy was performed without difficulty. The                            patient tolerated the procedure well. The quality                            of the bowel preparation was good. The ileocecal                            valve, appendiceal orifice, and rectum were                            photographed. Scope In: 2:02:10 PM Scope Out: 2:19:06 PM Scope Withdrawal Time: 0 hours 14 minutes 4 seconds  Total Procedure Duration: 0 hours 16 minutes 56 seconds  Findings:                 The perianal and digital rectal examinations were                            normal.                           A 3 mm polyp was found in the cecum. The polyp was                            sessile. The polyp was removed with a cold snare.                            Resection and retrieval were complete.                           Multiple small-mouthed diverticula were found in                            the sigmoid colon.  The exam was otherwise without abnormality on                            direct and retroflexion views. Complications:            No immediate complications. Estimated blood loss:                            Minimal. Estimated Blood Loss:     Estimated blood loss was minimal. Impression:               - One 3 mm polyp in the cecum, removed with a cold                            snare. Resected and retrieved.                           - Diverticulosis in the sigmoid colon.                           - The examination was otherwise normal on direct                            and retroflexion views. Recommendation:           - Patient has a contact number available for                            emergencies. The signs and symptoms of potential                            delayed complications were discussed with the                            patient. Return to normal activities tomorrow.                             Written discharge instructions were provided to the                            patient.                           - Resume previous diet.                           - Continue present medications.                           - Await pathology results.                           - Repeat colonoscopy is recommended for                            surveillance. The colonoscopy date will be  determined after pathology results from today's                            exam become available for review. Remo Lipps P. Evanee Lubrano MD, MD 09/21/2017 2:22:12 PM This report has been signed electronically.

## 2017-09-21 NOTE — Progress Notes (Signed)
To recovery, report to RN, VSS. 

## 2017-09-21 NOTE — Progress Notes (Signed)
Pt's states no medical or surgical changes since previsit or office visit.  No egg or soy allergy  

## 2017-09-21 NOTE — Progress Notes (Signed)
Called to room to assist during endoscopic procedure.  Patient ID and intended procedure confirmed with present staff. Received instructions for my participation in the procedure from the performing physician.  

## 2017-09-24 ENCOUNTER — Telehealth: Payer: Self-pay

## 2017-09-24 NOTE — Telephone Encounter (Signed)
  Follow up Call-     Patient questions:  Do you have a fever, pain , or abdominal swelling? No. Pain Score  0 *  Have you tolerated food without any problems? Yes.    Have you been able to return to your normal activities? Yes.    Do you have any questions about your discharge instructions: Diet   No. Medications  No. Follow up visit  No.  Do you have questions or concerns about your Care? No.  Actions: * If pain score is 4 or above: No action needed, pain <4.

## 2017-09-30 ENCOUNTER — Encounter: Payer: Self-pay | Admitting: Gastroenterology

## 2017-10-10 DIAGNOSIS — H6983 Other specified disorders of Eustachian tube, bilateral: Secondary | ICD-10-CM | POA: Diagnosis not present

## 2017-10-10 DIAGNOSIS — Z6830 Body mass index (BMI) 30.0-30.9, adult: Secondary | ICD-10-CM | POA: Diagnosis not present

## 2017-12-18 DIAGNOSIS — R05 Cough: Secondary | ICD-10-CM | POA: Diagnosis not present

## 2017-12-25 DIAGNOSIS — J040 Acute laryngitis: Secondary | ICD-10-CM | POA: Diagnosis not present

## 2017-12-25 DIAGNOSIS — J40 Bronchitis, not specified as acute or chronic: Secondary | ICD-10-CM | POA: Diagnosis not present

## 2017-12-25 DIAGNOSIS — Z6830 Body mass index (BMI) 30.0-30.9, adult: Secondary | ICD-10-CM | POA: Diagnosis not present

## 2017-12-25 DIAGNOSIS — R05 Cough: Secondary | ICD-10-CM | POA: Diagnosis not present

## 2018-02-09 DIAGNOSIS — H524 Presbyopia: Secondary | ICD-10-CM | POA: Diagnosis not present

## 2018-02-11 DIAGNOSIS — N182 Chronic kidney disease, stage 2 (mild): Secondary | ICD-10-CM | POA: Diagnosis not present

## 2018-02-11 DIAGNOSIS — I10 Essential (primary) hypertension: Secondary | ICD-10-CM | POA: Diagnosis not present

## 2018-02-11 DIAGNOSIS — R82998 Other abnormal findings in urine: Secondary | ICD-10-CM | POA: Diagnosis not present

## 2018-02-11 DIAGNOSIS — Z0000 Encounter for general adult medical examination without abnormal findings: Secondary | ICD-10-CM | POA: Diagnosis not present

## 2018-02-15 DIAGNOSIS — Z1212 Encounter for screening for malignant neoplasm of rectum: Secondary | ICD-10-CM | POA: Diagnosis not present

## 2018-02-18 DIAGNOSIS — I10 Essential (primary) hypertension: Secondary | ICD-10-CM | POA: Diagnosis not present

## 2018-02-18 DIAGNOSIS — R05 Cough: Secondary | ICD-10-CM | POA: Diagnosis not present

## 2018-02-18 DIAGNOSIS — Z0000 Encounter for general adult medical examination without abnormal findings: Secondary | ICD-10-CM | POA: Diagnosis not present

## 2018-02-18 DIAGNOSIS — J310 Chronic rhinitis: Secondary | ICD-10-CM | POA: Diagnosis not present

## 2018-02-18 DIAGNOSIS — Z1389 Encounter for screening for other disorder: Secondary | ICD-10-CM | POA: Diagnosis not present

## 2018-02-18 DIAGNOSIS — E7849 Other hyperlipidemia: Secondary | ICD-10-CM | POA: Diagnosis not present

## 2018-02-28 DIAGNOSIS — Z6831 Body mass index (BMI) 31.0-31.9, adult: Secondary | ICD-10-CM | POA: Diagnosis not present

## 2018-02-28 DIAGNOSIS — H9191 Unspecified hearing loss, right ear: Secondary | ICD-10-CM | POA: Diagnosis not present

## 2018-02-28 DIAGNOSIS — H6121 Impacted cerumen, right ear: Secondary | ICD-10-CM | POA: Diagnosis not present

## 2018-03-11 DIAGNOSIS — R600 Localized edema: Secondary | ICD-10-CM | POA: Diagnosis not present

## 2018-03-11 DIAGNOSIS — I872 Venous insufficiency (chronic) (peripheral): Secondary | ICD-10-CM | POA: Diagnosis not present

## 2018-03-21 DIAGNOSIS — Z23 Encounter for immunization: Secondary | ICD-10-CM | POA: Diagnosis not present

## 2018-05-08 DIAGNOSIS — J040 Acute laryngitis: Secondary | ICD-10-CM | POA: Diagnosis not present

## 2018-05-13 DIAGNOSIS — Z6831 Body mass index (BMI) 31.0-31.9, adult: Secondary | ICD-10-CM | POA: Diagnosis not present

## 2018-05-13 DIAGNOSIS — J40 Bronchitis, not specified as acute or chronic: Secondary | ICD-10-CM | POA: Diagnosis not present

## 2018-05-13 DIAGNOSIS — R05 Cough: Secondary | ICD-10-CM | POA: Diagnosis not present

## 2018-05-13 DIAGNOSIS — J310 Chronic rhinitis: Secondary | ICD-10-CM | POA: Diagnosis not present

## 2018-07-26 DIAGNOSIS — Z1231 Encounter for screening mammogram for malignant neoplasm of breast: Secondary | ICD-10-CM | POA: Diagnosis not present

## 2018-07-26 DIAGNOSIS — Z1389 Encounter for screening for other disorder: Secondary | ICD-10-CM | POA: Diagnosis not present

## 2018-07-26 DIAGNOSIS — Z01419 Encounter for gynecological examination (general) (routine) without abnormal findings: Secondary | ICD-10-CM | POA: Diagnosis not present

## 2018-07-26 DIAGNOSIS — Z130 Encounter for screening for diseases of the blood and blood-forming organs and certain disorders involving the immune mechanism: Secondary | ICD-10-CM | POA: Diagnosis not present

## 2018-07-26 DIAGNOSIS — Z6829 Body mass index (BMI) 29.0-29.9, adult: Secondary | ICD-10-CM | POA: Diagnosis not present

## 2018-09-02 DIAGNOSIS — Z23 Encounter for immunization: Secondary | ICD-10-CM | POA: Diagnosis not present

## 2019-02-17 DIAGNOSIS — R82998 Other abnormal findings in urine: Secondary | ICD-10-CM | POA: Diagnosis not present

## 2019-02-17 DIAGNOSIS — I10 Essential (primary) hypertension: Secondary | ICD-10-CM | POA: Diagnosis not present

## 2019-02-17 DIAGNOSIS — N182 Chronic kidney disease, stage 2 (mild): Secondary | ICD-10-CM | POA: Diagnosis not present

## 2019-02-24 DIAGNOSIS — M25562 Pain in left knee: Secondary | ICD-10-CM | POA: Diagnosis not present

## 2019-02-24 DIAGNOSIS — Z1331 Encounter for screening for depression: Secondary | ICD-10-CM | POA: Diagnosis not present

## 2019-02-24 DIAGNOSIS — H6121 Impacted cerumen, right ear: Secondary | ICD-10-CM | POA: Diagnosis not present

## 2019-02-24 DIAGNOSIS — E669 Obesity, unspecified: Secondary | ICD-10-CM | POA: Diagnosis not present

## 2019-02-24 DIAGNOSIS — R600 Localized edema: Secondary | ICD-10-CM | POA: Diagnosis not present

## 2019-02-24 DIAGNOSIS — M25561 Pain in right knee: Secondary | ICD-10-CM | POA: Diagnosis not present

## 2019-02-24 DIAGNOSIS — Z23 Encounter for immunization: Secondary | ICD-10-CM | POA: Diagnosis not present

## 2019-04-01 DIAGNOSIS — Z780 Asymptomatic menopausal state: Secondary | ICD-10-CM | POA: Diagnosis not present

## 2019-04-09 DIAGNOSIS — Z23 Encounter for immunization: Secondary | ICD-10-CM | POA: Diagnosis not present

## 2019-06-07 DIAGNOSIS — Z20828 Contact with and (suspected) exposure to other viral communicable diseases: Secondary | ICD-10-CM | POA: Diagnosis not present

## 2019-07-28 DIAGNOSIS — Z01419 Encounter for gynecological examination (general) (routine) without abnormal findings: Secondary | ICD-10-CM | POA: Diagnosis not present

## 2019-07-28 DIAGNOSIS — Z1231 Encounter for screening mammogram for malignant neoplasm of breast: Secondary | ICD-10-CM | POA: Diagnosis not present

## 2019-07-28 DIAGNOSIS — Z6829 Body mass index (BMI) 29.0-29.9, adult: Secondary | ICD-10-CM | POA: Diagnosis not present

## 2019-07-28 DIAGNOSIS — Z1389 Encounter for screening for other disorder: Secondary | ICD-10-CM | POA: Diagnosis not present

## 2019-11-19 DIAGNOSIS — J029 Acute pharyngitis, unspecified: Secondary | ICD-10-CM | POA: Diagnosis not present

## 2019-11-19 DIAGNOSIS — R05 Cough: Secondary | ICD-10-CM | POA: Diagnosis not present

## 2019-11-19 DIAGNOSIS — J209 Acute bronchitis, unspecified: Secondary | ICD-10-CM | POA: Diagnosis not present

## 2019-11-19 DIAGNOSIS — Z1152 Encounter for screening for COVID-19: Secondary | ICD-10-CM | POA: Diagnosis not present

## 2020-02-13 DIAGNOSIS — H3581 Retinal edema: Secondary | ICD-10-CM | POA: Diagnosis not present

## 2020-02-13 DIAGNOSIS — H2513 Age-related nuclear cataract, bilateral: Secondary | ICD-10-CM | POA: Diagnosis not present

## 2020-02-13 DIAGNOSIS — H35371 Puckering of macula, right eye: Secondary | ICD-10-CM | POA: Diagnosis not present

## 2020-02-13 DIAGNOSIS — H43813 Vitreous degeneration, bilateral: Secondary | ICD-10-CM | POA: Diagnosis not present

## 2020-02-19 DIAGNOSIS — R82998 Other abnormal findings in urine: Secondary | ICD-10-CM | POA: Diagnosis not present

## 2020-02-19 DIAGNOSIS — E7849 Other hyperlipidemia: Secondary | ICD-10-CM | POA: Diagnosis not present

## 2020-02-19 DIAGNOSIS — Z0000 Encounter for general adult medical examination without abnormal findings: Secondary | ICD-10-CM | POA: Diagnosis not present

## 2020-02-20 DIAGNOSIS — Z1212 Encounter for screening for malignant neoplasm of rectum: Secondary | ICD-10-CM | POA: Diagnosis not present

## 2020-02-26 DIAGNOSIS — M1711 Unilateral primary osteoarthritis, right knee: Secondary | ICD-10-CM | POA: Diagnosis not present

## 2020-02-26 DIAGNOSIS — M1712 Unilateral primary osteoarthritis, left knee: Secondary | ICD-10-CM | POA: Diagnosis not present

## 2020-02-26 DIAGNOSIS — I10 Essential (primary) hypertension: Secondary | ICD-10-CM | POA: Diagnosis not present

## 2020-02-26 DIAGNOSIS — Z0000 Encounter for general adult medical examination without abnormal findings: Secondary | ICD-10-CM | POA: Diagnosis not present

## 2020-02-26 DIAGNOSIS — E7849 Other hyperlipidemia: Secondary | ICD-10-CM | POA: Diagnosis not present

## 2020-03-11 DIAGNOSIS — H2511 Age-related nuclear cataract, right eye: Secondary | ICD-10-CM | POA: Diagnosis not present

## 2020-03-11 DIAGNOSIS — H2513 Age-related nuclear cataract, bilateral: Secondary | ICD-10-CM | POA: Diagnosis not present

## 2020-03-11 DIAGNOSIS — H35371 Puckering of macula, right eye: Secondary | ICD-10-CM | POA: Diagnosis not present

## 2020-03-11 DIAGNOSIS — H25043 Posterior subcapsular polar age-related cataract, bilateral: Secondary | ICD-10-CM | POA: Diagnosis not present

## 2020-03-11 DIAGNOSIS — H25013 Cortical age-related cataract, bilateral: Secondary | ICD-10-CM | POA: Diagnosis not present

## 2020-03-11 DIAGNOSIS — H18413 Arcus senilis, bilateral: Secondary | ICD-10-CM | POA: Diagnosis not present

## 2020-04-26 DIAGNOSIS — H2511 Age-related nuclear cataract, right eye: Secondary | ICD-10-CM | POA: Diagnosis not present

## 2020-04-26 DIAGNOSIS — H35371 Puckering of macula, right eye: Secondary | ICD-10-CM | POA: Diagnosis not present

## 2020-04-26 DIAGNOSIS — H3581 Retinal edema: Secondary | ICD-10-CM | POA: Diagnosis not present

## 2020-04-27 DIAGNOSIS — H35371 Puckering of macula, right eye: Secondary | ICD-10-CM | POA: Diagnosis not present

## 2020-04-27 DIAGNOSIS — H3581 Retinal edema: Secondary | ICD-10-CM | POA: Diagnosis not present

## 2020-05-04 DIAGNOSIS — H3581 Retinal edema: Secondary | ICD-10-CM | POA: Diagnosis not present

## 2020-05-11 DIAGNOSIS — H25012 Cortical age-related cataract, left eye: Secondary | ICD-10-CM | POA: Diagnosis not present

## 2020-05-11 DIAGNOSIS — H2512 Age-related nuclear cataract, left eye: Secondary | ICD-10-CM | POA: Diagnosis not present

## 2020-05-11 DIAGNOSIS — H25042 Posterior subcapsular polar age-related cataract, left eye: Secondary | ICD-10-CM | POA: Diagnosis not present

## 2020-05-19 DIAGNOSIS — Z23 Encounter for immunization: Secondary | ICD-10-CM | POA: Diagnosis not present

## 2020-05-26 DIAGNOSIS — Z48810 Encounter for surgical aftercare following surgery on the sense organs: Secondary | ICD-10-CM | POA: Diagnosis not present

## 2020-05-26 DIAGNOSIS — H3581 Retinal edema: Secondary | ICD-10-CM | POA: Diagnosis not present

## 2020-06-08 DIAGNOSIS — H2512 Age-related nuclear cataract, left eye: Secondary | ICD-10-CM | POA: Diagnosis not present

## 2020-06-21 DIAGNOSIS — H2512 Age-related nuclear cataract, left eye: Secondary | ICD-10-CM | POA: Diagnosis not present

## 2020-06-28 DIAGNOSIS — H524 Presbyopia: Secondary | ICD-10-CM | POA: Diagnosis not present

## 2020-08-18 DIAGNOSIS — Z20828 Contact with and (suspected) exposure to other viral communicable diseases: Secondary | ICD-10-CM | POA: Diagnosis not present

## 2021-12-13 ENCOUNTER — Other Ambulatory Visit: Payer: Self-pay | Admitting: Obstetrics and Gynecology

## 2021-12-13 DIAGNOSIS — R928 Other abnormal and inconclusive findings on diagnostic imaging of breast: Secondary | ICD-10-CM

## 2023-11-23 ENCOUNTER — Ambulatory Visit: Payer: Self-pay | Admitting: Orthopedic Surgery

## 2023-12-14 NOTE — Patient Instructions (Addendum)
 SURGICAL WAITING ROOM VISITATION  Patients having surgery or a procedure may have no more than 2 support people in the waiting area - these visitors may rotate.    Children under the age of 73 must have an adult with them who is not the patient.  Due to an increase in RSV and influenza rates and associated hospitalizations, children ages 65 and under may not visit patients in [facility] hospitals.  Visitors with respiratory illnesses are discouraged from visiting and should remain at home.  If the patient needs to stay at the hospital during part of their recovery, the visitor guidelines for inpatient rooms apply. Pre-op nurse will coordinate an appropriate time for 1 support person to accompany patient in pre-op.  This support person may not rotate.    Please refer to the [facility] website for the visitor guidelines for Inpatients (after your surgery is over and you are in a regular room).    Your procedure is scheduled on: 01/11/24   Report to [facility] Main Entrance    Report to admitting at 10:15 AM   Call this number if you have problems the morning of surgery [phone]   Do not eat food :After Midnight.   After Midnight you may have the following liquids until 9:45 AM DAY OF SURGERY  Water Non-Citrus Juices (without pulp, NO RED-Apple, White grape, White cranberry) Black Coffee (NO MILK/CREAM OR CREAMERS, sugar ok)  Clear Tea (NO MILK/CREAM OR CREAMERS, sugar ok) regular and decaf                             Plain Jell-O (NO RED)                                           Fruit ices (not with fruit pulp, NO RED)                                     Popsicles (NO RED)                                                               Sports drinks like Gatorade (NO RED)                 The day of surgery:  Drink ONE (1) Pre-Surgery Clear Ensure at 9:45 AM the morning of surgery. Drink in one sitting. Do not sip.  This drink was given to you during your hospital   pre-op appointment visit. Nothing else to drink after completing the  Pre-Surgery Clear Ensure          If you have questions, please contact your surgeon's office.   FOLLOW BOWEL PREP AND ANY ADDITIONAL PRE OP INSTRUCTIONS YOU RECEIVED FROM YOUR SURGEON'S OFFICE!!!     Oral Hygiene is also important to reduce your risk of infection.                                    Remember -  BRUSH YOUR TEETH THE MORNING OF SURGERY WITH YOUR REGULAR TOOTHPASTE  DENTURES WILL BE REMOVED PRIOR TO SURGERY PLEASE DO NOT APPLY "Poly grip" OR ADHESIVES!!!   Stop all vitamins and herbal supplements 7 days before surgery.   Take these medicines the morning of surgery with A SIP OF WATER: Amlodipine, Claritin, Pravastatin                               You may not have any metal on your body including hair pins, jewelry, and body piercing             Do not wear make-up, lotions, powders, perfumes, or deodorant  Do not wear nail polish including gel and S&S, artificial/acrylic nails, or any other type of covering on natural nails including finger and toenails. If you have artificial nails, gel coating, etc. that needs to be removed by a nail salon please have this removed prior to surgery or surgery may need to be canceled/ delayed if the surgeon/ anesthesia feels like they are unable to be safely monitored.   Do not shave  48 hours prior to surgery.    Do not bring valuables to the hospital. [place] IS NOT             RESPONSIBLE   FOR VALUABLES.   Contacts, glasses, dentures or bridgework may not be worn into surgery.   Bring small overnight bag day of surgery.   DO NOT BRING YOUR HOME MEDICATIONS TO THE HOSPITAL. PHARMACY WILL DISPENSE MEDICATIONS LISTED ON YOUR MEDICATION LIST TO YOU DURING YOUR ADMISSION IN THE HOSPITAL!              Please read over the following fact sheets you were given: IF YOU HAVE QUESTIONS ABOUT YOUR PRE-OP INSTRUCTIONS PLEASE CALL [name]   If you  received a COVID test during your pre-op visit  it is requested that you wear a mask when out in public, stay away from anyone that may not be feeling well and notify your surgeon if you develop symptoms. If you test positive for Covid or have been in contact with anyone that has tested positive in the last 10 days please notify you surgeon.      Pre-operative 5 CHG Bath Instructions   You can play a key role in reducing the risk of infection after surgery. Your skin needs to be as free of germs as possible. You can reduce the number of germs on your skin by washing with CHG (chlorhexidine gluconate) soap before surgery. CHG is an antiseptic soap that kills germs and continues to kill germs even after washing.   DO NOT use if you have an allergy to chlorhexidine/CHG or antibacterial soaps. If your skin becomes reddened or irritated, stop using the CHG and notify one of our RNs at [phone].   Please shower with the CHG soap starting 4 days before surgery using the following schedule:     Please keep in mind the following:  DO NOT shave, including legs and underarms, starting the day of your first shower.   You may shave your face at any point before/day of surgery.  Place clean sheets on your bed the day you start using CHG soap. Use a clean washcloth (not used since being washed) for each shower. DO NOT sleep with pets once you start using the CHG.   CHG Shower Instructions:  If you choose to wash your  hair and private area, wash first with your normal shampoo/soap.  After you use shampoo/soap, rinse your hair and body thoroughly to remove shampoo/soap residue.  Turn the water OFF and apply about 3 tablespoons (45 ml) of CHG soap to a CLEAN washcloth.  Apply CHG soap ONLY FROM YOUR NECK DOWN TO YOUR TOES (washing for 3-5 minutes)  DO NOT use CHG soap on face, private areas, open wounds, or sores.  Pay special attention to the area where your surgery is being performed.  If you are  having back surgery, having someone wash your back for you may be helpful. Wait 2 minutes after CHG soap is applied, then you may rinse off the CHG soap.  Pat dry with a clean towel  Put on clean clothes/pajamas   If you choose to wear lotion, please use ONLY the CHG-compatible lotions on the back of this paper.     Additional instructions for the day of surgery: DO NOT APPLY any lotions, deodorants, cologne, or perfumes.   Put on clean/comfortable clothes.  Brush your teeth.  Ask your nurse before applying any prescription medications to the skin.      CHG Compatible Lotions   Aveeno Moisturizing lotion  Cetaphil Moisturizing Cream  Cetaphil Moisturizing Lotion  Clairol Herbal Essence Moisturizing Lotion, Dry Skin  Clairol Herbal Essence Moisturizing Lotion, Extra Dry Skin  Clairol Herbal Essence Moisturizing Lotion, Normal Skin  Curel Age Defying Therapeutic Moisturizing Lotion with Alpha Hydroxy  Curel Extreme Care Body Lotion  Curel Soothing Hands Moisturizing Hand Lotion  Curel Therapeutic Moisturizing Cream, Fragrance-Free  Curel Therapeutic Moisturizing Lotion, Fragrance-Free  Curel Therapeutic Moisturizing Lotion, Original Formula  Eucerin Daily Replenishing Lotion  Eucerin Dry Skin Therapy Plus Alpha Hydroxy Crme  Eucerin Dry Skin Therapy Plus Alpha Hydroxy Lotion  Eucerin Original Crme  Eucerin Original Lotion  Eucerin Plus Crme Eucerin Plus Lotion  Eucerin TriLipid Replenishing Lotion  Keri Anti-Bacterial Hand Lotion  Keri Deep Conditioning Original Lotion Dry Skin Formula Softly Scented  Keri Deep Conditioning Original Lotion, Fragrance Free Sensitive Skin Formula  Keri Lotion Fast Absorbing Fragrance Free Sensitive Skin Formula  Keri Lotion Fast Absorbing Softly Scented Dry Skin Formula  Keri Original Lotion  Keri Skin Renewal Lotion Keri Silky Smooth Lotion  Keri Silky Smooth Sensitive Skin Lotion  Nivea Body Creamy Conditioning Oil  Nivea Body Extra  Enriched Lotion  Nivea Body Original Lotion  Nivea Body Sheer Moisturizing Lotion Nivea Crme  Nivea Skin Firming Lotion  NutraDerm 30 Skin Lotion  NutraDerm Skin Lotion  NutraDerm Therapeutic Skin Cream  NutraDerm Therapeutic Skin Lotion  ProShield Protective Hand Cream  Provon moisturizing lotion   View Pre-Surgery Education Videos:  [email or web address]     Incentive Spirometer  An incentive spirometer is a tool that can help keep your lungs clear and active. This tool measures how well you are filling your lungs with each breath. Taking long deep breaths may help reverse or decrease the chance of developing breathing (pulmonary) problems (especially infection) following: A long period of time when you are unable to move or be active. BEFORE THE PROCEDURE  If the spirometer includes an indicator to show your best effort, your nurse or respiratory therapist will set it to a desired goal. If possible, sit up straight or lean slightly forward. Try not to slouch. Hold the incentive spirometer in an upright position. INSTRUCTIONS FOR USE  Sit on the edge of your bed if possible, or sit up as far as you can in bed or  on a chair. Hold the incentive spirometer in an upright position. Breathe out normally. Place the mouthpiece in your mouth and seal your lips tightly around it. Breathe in slowly and as deeply as possible, raising the piston or the ball toward the top of the column. Hold your breath for 3-5 seconds or for as long as possible. Allow the piston or ball to fall to the bottom of the column. Remove the mouthpiece from your mouth and breathe out normally. Rest for a few seconds and repeat Steps 1 through 7 at least 10 times every 1-2 hours when you are awake. Take your time and take a few normal breaths between deep breaths. The spirometer may include an indicator to show your best effort. Use the indicator as a goal to  work toward during each repetition. After each set of 10 deep breaths, practice coughing to be sure your lungs are clear. If you have an incision (the cut made at the time of surgery), support your incision when coughing by placing a pillow or rolled up towels firmly against it. Once you are able to get out of bed, walk around indoors and cough well. You may stop using the incentive spirometer when instructed by your caregiver.  RISKS AND COMPLICATIONS Take your time so you do not get dizzy or light-headed. If you are in pain, you may need to take or ask for pain medication before doing incentive spirometry. It is harder to take a deep breath if you are having pain. AFTER USE Rest and breathe slowly and easily. It can be helpful to keep track of a log of your progress. Your caregiver can provide you with a simple table to help with this. If you are using the spirometer at home, follow these instructions: SEEK MEDICAL CARE IF:  You are having difficultly using the spirometer. You have trouble using the spirometer as often as instructed. Your pain medication is not giving enough relief while using the spirometer. You develop fever of 100.5 F (38.1 C) or higher. SEEK IMMEDIATE MEDICAL CARE IF:  You cough up bloody sputum that had not been present before. You develop fever of 102 F (38.9 C) or greater. You develop worsening pain at or near the incision site. MAKE SURE YOU:  Understand these instructions. Will watch your condition. Will get help right away if you are not doing well or get worse. Document Released: 11/27/2006 Document Revised: 10/09/2011 Document Reviewed: 01/28/2007 [facility] Patient Information 2014 [place], [place].   ________________________________________________________________________

## 2023-12-14 NOTE — Progress Notes (Addendum)
 COVID Vaccine Completed:  Date of COVID positive in last 90 days:  PCP - Eilene Grater, MD Cardiologist - n/a  Chest x-ray - in Dec, with PCP EKG - 12/17/23 Epic/chart Stress Test - n/a ECHO - n/a Cardiac Cath - n/a Pacemaker/ICD device last checked: n/a Spinal Cord Stimulator: n/a  Bowel Prep - no  Sleep Study - n/a CPAP -   Fasting Blood Sugar - n/a Checks Blood Sugar _____ times a day  Last dose of GLP1 agonist-  N/A GLP1 instructions:  Hold 7 days before surgery    Last dose of SGLT-2 inhibitors-  N/A SGLT-2 instructions:  Hold 3 days before surgery    Blood Thinner Instructions:  Last dose:   Time: Aspirin Instructions: ASA 81, hold 10 days Last Dose:  Activity level: Can go up a flight of stairs and perform activities of daily living without stopping and without symptoms of chest pain or shortness of breath.   Anesthesia review:   Patient denies shortness of breath, fever, cough and chest pain at PAT appointment  Patient verbalized understanding of instructions that were given to them at the PAT appointment. Patient was also instructed that they will need to review over the PAT instructions again at home before surgery.

## 2023-12-17 ENCOUNTER — Encounter (HOSPITAL_COMMUNITY)
Admission: RE | Admit: 2023-12-17 | Discharge: 2023-12-17 | Disposition: A | Discharge status: Home / Self Care | Source: Ambulatory Visit | Attending: Specialist | Admitting: Specialist

## 2023-12-17 ENCOUNTER — Encounter (HOSPITAL_COMMUNITY): Payer: Self-pay

## 2023-12-17 ENCOUNTER — Other Ambulatory Visit: Payer: Self-pay

## 2023-12-17 VITALS — BP 141/83 | HR 99 | Temp 98.0°F | Resp 16 | Ht 63.0 in | Wt 195.0 lb

## 2023-12-17 DIAGNOSIS — Z01818 Encounter for other preprocedural examination: Secondary | ICD-10-CM | POA: Insufficient documentation

## 2023-12-17 DIAGNOSIS — I10 Essential (primary) hypertension: Secondary | ICD-10-CM | POA: Insufficient documentation

## 2023-12-17 HISTORY — DX: Unspecified osteoarthritis, unspecified site: M19.90

## 2023-12-17 LAB — CBC
HCT: 44.4 % (ref 36.0–46.0)
Hemoglobin: 13.3 g/dL (ref 12.0–15.0)
MCH: 27.9 pg (ref 26.0–34.0)
MCHC: 30 g/dL (ref 30.0–36.0)
MCV: 93.3 fL (ref 80.0–100.0)
Platelets: 302 10*3/uL (ref 150–400)
RBC: 4.76 MIL/uL (ref 3.87–5.11)
RDW: 14.5 % (ref 11.5–15.5)
WBC: 9.1 10*3/uL (ref 4.0–10.5)
nRBC: 0 % (ref 0.0–0.2)

## 2023-12-17 LAB — BASIC METABOLIC PANEL WITH GFR
Anion gap: 9 (ref 5–15)
BUN: 13 mg/dL (ref 8–23)
CO2: 28 mmol/L (ref 22–32)
Calcium: 9.3 mg/dL (ref 8.9–10.3)
Chloride: 104 mmol/L (ref 98–111)
Creatinine, Ser: 0.83 mg/dL (ref 0.44–1.00)
GFR, Estimated: >60 mL/min (ref >60)
Glucose, Bld: 111 mg/dL — ABNORMAL HIGH (ref 70–99)
Potassium: 4.9 mmol/L (ref 3.5–5.1)
Sodium: 141 mmol/L (ref 135–145)

## 2023-12-21 NOTE — Progress Notes (Signed)
 Notified by lab that patient's PCR collected at preop visit was invalid and would need to be recollected day of surgery.  Martin Slay, RN notified.

## 2024-01-07 ENCOUNTER — Ambulatory Visit: Payer: Self-pay | Admitting: Orthopedic Surgery

## 2024-01-07 NOTE — H&P (Signed)
 Tara Baker is an 68 y.o. female.   Chief Complaint: right knee pain HPI: Patient is here for her H&P. Patient is scheduled for a RTKA by Dr. Leighton Punches on 01/11/24 at [facility].  Dr. Leighton Punches and the patient mutually agreed to proceed with a total knee replacement. Risks and benefits of the procedure were discussed including stiffness, suboptimal range of motion, persistent pain, infection requiring removal of prosthesis and reinsertion, need for prophylactic antibiotics in the future, for example, dental procedures, possible need for manipulation, revision in the future and also anesthetic complications including DVT, PE, etc. We discussed the perioperative course, time in the hospital, postoperative recovery and the need for elevation to control swelling. We also discussed the predicted range of motion and the probability that squatting and kneeling would be unobtainable in the future. In addition, postoperative anticoagulation was discussed. We have obtained preoperative medical clearance as necessary. Provided illustrated handout and discussed it in detail. They will enroll in the total joint replacement educational forum at the hospital.  She is already had her Tara Baker preop appointment. She did in the last day or 2 start developing a cough and some chest congestion and will be seeing her PCP about that today. Denies any fever, cough is not productive.  Past Medical History:  Diagnosis Date   Arthritis    Hyperlipidemia    Hypertension     Past Surgical History:  Procedure Laterality Date   CATARACT EXTRACTION Bilateral    COLONOSCOPY     PARTIAL HYSTERECTOMY  1987   TUBAL LIGATION  1981    Family History  Problem Relation Age of Onset   Colon cancer Neg Hx    Social History:  reports that she has quit smoking. She has never used smokeless tobacco. She reports that she does not drink alcohol and does not use drugs.  Allergies: No Known Allergies  Current  meds: amLODIPine aspirin chlorhexidine  gluconate 4 % topical liquid Claritin 10 mg tablet gabapentin losartan pravastatin Vitamin D  Review of Systems  Constitutional: Negative.   HENT: Negative.    Eyes: Negative.   Respiratory: Negative.    Cardiovascular: Negative.   Gastrointestinal: Negative.   Endocrine: Negative.   Genitourinary: Negative.   Musculoskeletal:  Positive for arthralgias, gait problem, joint swelling and myalgias.  Skin: Negative.   Psychiatric/Behavioral: Negative.      There were no vitals taken for this visit. Physical Exam Constitutional:      Appearance: Normal appearance.  HENT:     Head: Normocephalic and atraumatic.     Right Ear: External ear normal.     Left Ear: External ear normal.     Nose: Nose normal.     Mouth/Throat:     Pharynx: Oropharynx is clear.  Eyes:     Conjunctiva/sclera: Conjunctivae normal.  Cardiovascular:     Rate and Rhythm: Normal rate.     Pulses: Normal pulses.  Pulmonary:     Effort: Pulmonary effort is normal.  Abdominal:     General: Bowel sounds are normal.  Musculoskeletal:     Cervical back: Normal range of motion.     Comments: Patient is tender in the lateral joint line bilaterally. Right greater than left her range of motion is 0-1 40. Mild effusion. No DVT  Skin:    General: Skin is warm and dry.  Neurological:     Mental Status: She is alert.      Assessment/Plan Impression: Right knee end-stage osteoarthritis  Plan: Pt  with end-stage right knee DJD, bone-on-bone, refractory to conservative tx, scheduled for right total knee replacement by Dr. Leighton Punches on June 13. We again discussed the procedure itself as well as risks, complications and alternatives, including but not limited to DVT, PE, infx, bleeding, failure of procedure, need for secondary procedure including manipulation, nerve injury, ongoing pain/symptoms, anesthesia risk, even stroke or death. Also discussed typical post-op protocols,  activity restrictions, need for PT, flexion/extension exercises, time out of work. Discussed need for DVT ppx post-op per protocol. Discussed dental ppx and infx prevention. Also discussed limitations post-operatively such as kneeling and squatting. All questions were answered. Patient desires to proceed with surgery as scheduled.  Will hold supplements, ASA and NSAIDs accordingly. Will remain NPO after midnight the night before surgery. Will present to [facility] for pre-op testing. Anticipate hospital stay to include at least 2 midnights given medical history and to ensure proper pain control. Plan aspirin for DVT ppx post-op. Plan oxycodone , Robaxin , Colace, Miralax. Plan outpatient PT postop already scheduled for our location. Will follow up 10-14 days post-op for suture removal and xrays.  Plan Right total knee replacement  Tara Baker M Tara Hobbins, PA-C for Dr Leighton Punches 01/07/2024, 4:39 PM

## 2024-01-07 NOTE — H&P (View-Only) (Signed)
 Tara Baker is an 68 y.o. female.   Chief Complaint: right knee pain HPI: Patient is here for her H&P. Patient is scheduled for a RTKA by Dr. Leighton Punches on 01/11/24 at [facility].  Dr. Leighton Punches and the patient mutually agreed to proceed with a total knee replacement. Risks and benefits of the procedure were discussed including stiffness, suboptimal range of motion, persistent pain, infection requiring removal of prosthesis and reinsertion, need for prophylactic antibiotics in the future, for example, dental procedures, possible need for manipulation, revision in the future and also anesthetic complications including DVT, PE, etc. We discussed the perioperative course, time in the hospital, postoperative recovery and the need for elevation to control swelling. We also discussed the predicted range of motion and the probability that squatting and kneeling would be unobtainable in the future. In addition, postoperative anticoagulation was discussed. We have obtained preoperative medical clearance as necessary. Provided illustrated handout and discussed it in detail. They will enroll in the total joint replacement educational forum at the hospital.  She is already had her [name] preop appointment. She did in the last day or 2 start developing a cough and some chest congestion and will be seeing her PCP about that today. Denies any fever, cough is not productive.  Past Medical History:  Diagnosis Date   Arthritis    Hyperlipidemia    Hypertension     Past Surgical History:  Procedure Laterality Date   CATARACT EXTRACTION Bilateral    COLONOSCOPY     PARTIAL HYSTERECTOMY  1987   TUBAL LIGATION  1981    Family History  Problem Relation Age of Onset   Colon cancer Neg Hx    Social History:  reports that she has quit smoking. She has never used smokeless tobacco. She reports that she does not drink alcohol and does not use drugs.  Allergies: No Known Allergies  Current  meds: amLODIPine aspirin chlorhexidine  gluconate 4 % topical liquid Claritin 10 mg tablet gabapentin losartan pravastatin Vitamin D  Review of Systems  Constitutional: Negative.   HENT: Negative.    Eyes: Negative.   Respiratory: Negative.    Cardiovascular: Negative.   Gastrointestinal: Negative.   Endocrine: Negative.   Genitourinary: Negative.   Musculoskeletal:  Positive for arthralgias, gait problem, joint swelling and myalgias.  Skin: Negative.   Psychiatric/Behavioral: Negative.      There were no vitals taken for this visit. Physical Exam Constitutional:      Appearance: Normal appearance.  HENT:     Head: Normocephalic and atraumatic.     Right Ear: External ear normal.     Left Ear: External ear normal.     Nose: Nose normal.     Mouth/Throat:     Pharynx: Oropharynx is clear.  Eyes:     Conjunctiva/sclera: Conjunctivae normal.  Cardiovascular:     Rate and Rhythm: Normal rate.     Pulses: Normal pulses.  Pulmonary:     Effort: Pulmonary effort is normal.  Abdominal:     General: Bowel sounds are normal.  Musculoskeletal:     Cervical back: Normal range of motion.     Comments: Patient is tender in the lateral joint line bilaterally. Right greater than left her range of motion is 0-1 40. Mild effusion. No DVT  Skin:    General: Skin is warm and dry.  Neurological:     Mental Status: She is alert.      Assessment/Plan Impression: Right knee end-stage osteoarthritis  Plan: Pt  with end-stage right knee DJD, bone-on-bone, refractory to conservative tx, scheduled for right total knee replacement by Dr. Leighton Punches on June 13. We again discussed the procedure itself as well as risks, complications and alternatives, including but not limited to DVT, PE, infx, bleeding, failure of procedure, need for secondary procedure including manipulation, nerve injury, ongoing pain/symptoms, anesthesia risk, even stroke or death. Also discussed typical post-op protocols,  activity restrictions, need for PT, flexion/extension exercises, time out of work. Discussed need for DVT ppx post-op per protocol. Discussed dental ppx and infx prevention. Also discussed limitations post-operatively such as kneeling and squatting. All questions were answered. Patient desires to proceed with surgery as scheduled.  Will hold supplements, ASA and NSAIDs accordingly. Will remain NPO after midnight the night before surgery. Will present to [facility] for pre-op testing. Anticipate hospital stay to include at least 2 midnights given medical history and to ensure proper pain control. Plan aspirin for DVT ppx post-op. Plan oxycodone , Robaxin , Colace, Miralax. Plan outpatient PT postop already scheduled for our location. Will follow up 10-14 days post-op for suture removal and xrays.  Plan Right total knee replacement  Elly Haffey M Rekha Hobbins, PA-C for Dr Leighton Punches 01/07/2024, 4:39 PM

## 2024-01-11 ENCOUNTER — Ambulatory Visit (HOSPITAL_COMMUNITY)

## 2024-01-11 ENCOUNTER — Ambulatory Visit (HOSPITAL_COMMUNITY): Payer: Self-pay | Admitting: Anesthesiology

## 2024-01-11 ENCOUNTER — Ambulatory Visit (HOSPITAL_BASED_OUTPATIENT_CLINIC_OR_DEPARTMENT_OTHER): Payer: Self-pay | Admitting: Anesthesiology

## 2024-01-11 ENCOUNTER — Encounter (HOSPITAL_COMMUNITY): Admission: RE | Payer: Self-pay | Source: Home / Self Care

## 2024-01-11 ENCOUNTER — Ambulatory Visit (HOSPITAL_COMMUNITY)
Admission: RE | Admit: 2024-01-11 | Discharge: 2024-01-12 | Disposition: A | Discharge status: Home / Self Care | Attending: Specialist | Admitting: Specialist

## 2024-01-11 ENCOUNTER — Encounter (HOSPITAL_COMMUNITY): Payer: Self-pay | Admitting: Specialist

## 2024-01-11 ENCOUNTER — Other Ambulatory Visit: Payer: Self-pay

## 2024-01-11 DIAGNOSIS — I10 Essential (primary) hypertension: Secondary | ICD-10-CM | POA: Diagnosis not present

## 2024-01-11 DIAGNOSIS — Z87891 Personal history of nicotine dependence: Secondary | ICD-10-CM | POA: Insufficient documentation

## 2024-01-11 DIAGNOSIS — M21061 Valgus deformity, not elsewhere classified, right knee: Secondary | ICD-10-CM | POA: Insufficient documentation

## 2024-01-11 DIAGNOSIS — M1711 Unilateral primary osteoarthritis, right knee: Secondary | ICD-10-CM | POA: Diagnosis present

## 2024-01-11 DIAGNOSIS — Z79899 Other long term (current) drug therapy: Secondary | ICD-10-CM | POA: Insufficient documentation

## 2024-01-11 DIAGNOSIS — Z01818 Encounter for other preprocedural examination: Secondary | ICD-10-CM

## 2024-01-11 HISTORY — PX: TOTAL KNEE ARTHROPLASTY: SHX125

## 2024-01-11 HISTORY — DX: Bronchitis, not specified as acute or chronic: J40

## 2024-01-11 LAB — SURGICAL PCR SCREEN
MRSA, PCR: NEGATIVE
Staphylococcus aureus: NEGATIVE

## 2024-01-11 SURGERY — ARTHROPLASTY, KNEE, TOTAL
Anesthesia: Monitor Anesthesia Care | Site: Knee | Laterality: Right

## 2024-01-11 MED ORDER — MENTHOL 3 MG MT LOZG: 1.0000 | LOZENGE | OROMUCOSAL | Status: DC | PRN

## 2024-01-11 MED ORDER — ASPIRIN 81 MG PO CHEW
81.0000 mg | CHEWABLE_TABLET | Freq: Two times a day (BID) | ORAL | Status: DC
Start: 2024-01-12 — End: 2024-01-12
  Administered 2024-01-12: 81 mg via ORAL
  Filled 2024-01-11: qty 1

## 2024-01-11 MED ORDER — LIDOCAINE HCL (CARDIAC) PF 100 MG/5ML IV SOSY
PREFILLED_SYRINGE | INTRAVENOUS | Status: DC | PRN
  Administered 2024-01-11: 100 mg via INTRAVENOUS

## 2024-01-11 MED ORDER — METHOCARBAMOL 500 MG PO TABS
ORAL_TABLET | ORAL | Status: AC
  Filled 2024-01-11: qty 1

## 2024-01-11 MED ORDER — ACETAMINOPHEN 500 MG PO TABS
1000.0000 mg | ORAL_TABLET | Freq: Four times a day (QID) | ORAL | Status: DC
  Administered 2024-01-11 – 2024-01-12 (×2): 1000 mg via ORAL
  Filled 2024-01-11 (×2): qty 2

## 2024-01-11 MED ORDER — LOSARTAN POTASSIUM 50 MG PO TABS
100.0000 mg | ORAL_TABLET | Freq: Every day | ORAL | Status: DC
  Administered 2024-01-12: 100 mg via ORAL
  Filled 2024-01-11: qty 2

## 2024-01-11 MED ORDER — 0.9 % SODIUM CHLORIDE (POUR BTL) OPTIME: TOPICAL | Status: DC | PRN

## 2024-01-11 MED ORDER — BACID PO TABS: 2.0000 | ORAL_TABLET | Freq: Every day | ORAL | Status: DC

## 2024-01-11 MED ORDER — DIPHENHYDRAMINE HCL 12.5 MG/5ML PO ELIX: 12.5000 mg | ORAL_SOLUTION | ORAL | Status: DC | PRN

## 2024-01-11 MED ORDER — PROPOFOL 1000 MG/100ML IV EMUL
INTRAVENOUS | Status: AC
  Filled 2024-01-11: qty 100

## 2024-01-11 MED ORDER — PROPOFOL 10 MG/ML IV BOLUS
INTRAVENOUS | Status: DC | PRN
  Administered 2024-01-11 (×2): 20 mg via INTRAVENOUS
  Administered 2024-01-11: 30 mg via INTRAVENOUS

## 2024-01-11 MED ORDER — DOCUSATE SODIUM 100 MG PO CAPS
100.0000 mg | ORAL_CAPSULE | Freq: Two times a day (BID) | ORAL | Status: DC
  Administered 2024-01-11 – 2024-01-12 (×2): 100 mg via ORAL
  Filled 2024-01-11 (×2): qty 1

## 2024-01-11 MED ORDER — MEPERIDINE HCL 50 MG/ML IJ SOLN: 6.2500 mg | INTRAMUSCULAR | Status: DC | PRN

## 2024-01-11 MED ORDER — CEFAZOLIN SODIUM-DEXTROSE 2-4 GM/100ML-% IV SOLN
2.0000 g | Freq: Four times a day (QID) | INTRAVENOUS | Status: AC
  Administered 2024-01-11 (×2): 2 g via INTRAVENOUS
  Filled 2024-01-11 (×2): qty 100

## 2024-01-11 MED ORDER — SODIUM CHLORIDE (PF) 0.9 % IJ SOLN
INTRAMUSCULAR | Status: DC | PRN
  Administered 2024-01-11: 60 mL

## 2024-01-11 MED ORDER — OXYCODONE HCL 5 MG PO TABS
5.0000 mg | ORAL_TABLET | ORAL | Status: DC | PRN
  Filled 2024-01-11 (×2): qty 2

## 2024-01-11 MED ORDER — LACTATED RINGERS IV SOLN: INTRAVENOUS | Status: DC

## 2024-01-11 MED ORDER — POLYETHYLENE GLYCOL 3350 17 G PO PACK: 17.0000 g | PACK | Freq: Every day | ORAL | Status: DC | PRN

## 2024-01-11 MED ORDER — VITAMIN C 500 MG PO TABS
500.0000 mg | ORAL_TABLET | Freq: Every day | ORAL | Status: DC
  Filled 2024-01-11: qty 1

## 2024-01-11 MED ORDER — CEFAZOLIN SODIUM-DEXTROSE 2-4 GM/100ML-% IV SOLN
2.0000 g | INTRAVENOUS | Status: AC
  Administered 2024-01-11: 2 g via INTRAVENOUS
  Filled 2024-01-11: qty 100

## 2024-01-11 MED ORDER — TRANEXAMIC ACID-NACL 1000-0.7 MG/100ML-% IV SOLN
1000.0000 mg | INTRAVENOUS | Status: AC
  Administered 2024-01-11: 1000 mg via INTRAVENOUS
  Filled 2024-01-11: qty 100

## 2024-01-11 MED ORDER — CHLORHEXIDINE GLUCONATE 0.12 % MT SOLN
15.0000 mL | Freq: Once | OROMUCOSAL | Status: AC
Start: 2024-01-11 — End: 2024-01-11
  Administered 2024-01-11: 15 mL via OROMUCOSAL

## 2024-01-11 MED ORDER — CELECOXIB 200 MG PO CAPS
200.0000 mg | ORAL_CAPSULE | Freq: Once | ORAL | Status: DC
Start: 2024-01-11 — End: 2024-01-11

## 2024-01-11 MED ORDER — ONDANSETRON HCL 4 MG/2ML IJ SOLN
4.0000 mg | Freq: Four times a day (QID) | INTRAMUSCULAR | Status: DC | PRN
  Administered 2024-01-11: 4 mg via INTRAVENOUS
  Filled 2024-01-11 (×2): qty 2

## 2024-01-11 MED ORDER — PHENYLEPHRINE HCL-NACL 20-0.9 MG/250ML-% IV SOLN
INTRAVENOUS | Status: DC | PRN
Start: 2024-01-11 — End: 2024-01-11
  Administered 2024-01-11: 50 ug/min via INTRAVENOUS

## 2024-01-11 MED ORDER — VITAMIN D 25 MCG (1000 UNIT) PO TABS
2000.0000 [IU] | ORAL_TABLET | Freq: Every day | ORAL | Status: DC
  Administered 2024-01-12: 2000 [IU] via ORAL
  Filled 2024-01-11: qty 2

## 2024-01-11 MED ORDER — BUPIVACAINE-EPINEPHRINE (PF) 0.25% -1:200000 IJ SOLN
INTRAMUSCULAR | Status: AC
  Filled 2024-01-11: qty 30

## 2024-01-11 MED ORDER — MIDAZOLAM HCL 2 MG/2ML IJ SOLN
1.0000 mg | INTRAMUSCULAR | Status: DC
  Administered 2024-01-11: 2 mg via INTRAVENOUS
  Filled 2024-01-11: qty 2

## 2024-01-11 MED ORDER — HYDROMORPHONE HCL 1 MG/ML IJ SOLN
INTRAMUSCULAR | Status: DC | PRN
  Administered 2024-01-11 (×2): .5 mg via INTRAVENOUS

## 2024-01-11 MED ORDER — AZITHROMYCIN 250 MG PO TABS: 500.0000 mg | ORAL_TABLET | Freq: Every day | ORAL | Status: DC

## 2024-01-11 MED ORDER — BISACODYL 5 MG PO TBEC
5.0000 mg | DELAYED_RELEASE_TABLET | Freq: Every day | ORAL | Status: DC | PRN
Start: 2024-01-11 — End: 2024-01-12

## 2024-01-11 MED ORDER — SODIUM CHLORIDE (PF) 0.9 % IJ SOLN
INTRAMUSCULAR | Status: AC
  Filled 2024-01-11: qty 10

## 2024-01-11 MED ORDER — OXYCODONE HCL 5 MG PO TABS
10.0000 mg | ORAL_TABLET | ORAL | Status: DC | PRN
  Administered 2024-01-12 (×2): 10 mg via ORAL
  Filled 2024-01-11: qty 2

## 2024-01-11 MED ORDER — GABAPENTIN 300 MG PO CAPS
300.0000 mg | ORAL_CAPSULE | Freq: Every day | ORAL | Status: DC
  Administered 2024-01-11: 300 mg via ORAL
  Filled 2024-01-11: qty 1

## 2024-01-11 MED ORDER — METHOCARBAMOL 500 MG PO TABS
500.0000 mg | ORAL_TABLET | Freq: Four times a day (QID) | ORAL | Status: DC | PRN
  Administered 2024-01-11: 500 mg via ORAL
  Filled 2024-01-11 (×2): qty 1

## 2024-01-11 MED ORDER — LORATADINE 10 MG PO TABS
10.0000 mg | ORAL_TABLET | Freq: Every day | ORAL | Status: DC
  Administered 2024-01-12: 10 mg via ORAL
  Filled 2024-01-11: qty 1

## 2024-01-11 MED ORDER — ONDANSETRON HCL 4 MG PO TABS: 4.0000 mg | ORAL_TABLET | Freq: Four times a day (QID) | ORAL | Status: DC | PRN

## 2024-01-11 MED ORDER — ADULT MULTIVITAMIN W/MINERALS CH
1.0000 | ORAL_TABLET | Freq: Every day | ORAL | Status: DC
  Administered 2024-01-12: 1 via ORAL
  Filled 2024-01-11: qty 1

## 2024-01-11 MED ORDER — OXYCODONE HCL 5 MG/5ML PO SOLN: 5.0000 mg | Freq: Once | ORAL | Status: AC | PRN

## 2024-01-11 MED ORDER — POTASSIUM CHLORIDE IN NACL 20-0.9 MEQ/L-% IV SOLN
INTRAVENOUS | Status: DC
  Filled 2024-01-11: qty 1000

## 2024-01-11 MED ORDER — PROPOFOL 500 MG/50ML IV EMUL
INTRAVENOUS | Status: DC | PRN
  Administered 2024-01-11: 50 ug/kg/min via INTRAVENOUS

## 2024-01-11 MED ORDER — ORAL CARE MOUTH RINSE
15.0000 mL | Freq: Once | OROMUCOSAL | Status: AC
Start: 2024-01-11 — End: 2024-01-11

## 2024-01-11 MED ORDER — DEXAMETHASONE SODIUM PHOSPHATE 10 MG/ML IJ SOLN
INTRAMUSCULAR | Status: AC
  Filled 2024-01-11: qty 1

## 2024-01-11 MED ORDER — FENTANYL CITRATE PF 50 MCG/ML IJ SOSY
25.0000 ug | PREFILLED_SYRINGE | INTRAMUSCULAR | Status: DC | PRN
  Administered 2024-01-11: 50 ug via INTRAVENOUS

## 2024-01-11 MED ORDER — CELECOXIB 200 MG PO CAPS
200.0000 mg | ORAL_CAPSULE | Freq: Once | ORAL | Status: AC
  Administered 2024-01-11: 200 mg via ORAL
  Filled 2024-01-11: qty 1

## 2024-01-11 MED ORDER — ACETAMINOPHEN 500 MG PO TABS: 1000.0000 mg | ORAL_TABLET | Freq: Once | ORAL | Status: DC

## 2024-01-11 MED ORDER — PRAVASTATIN SODIUM 20 MG PO TABS
20.0000 mg | ORAL_TABLET | Freq: Every day | ORAL | Status: DC
  Administered 2024-01-12: 20 mg via ORAL
  Filled 2024-01-11: qty 1

## 2024-01-11 MED ORDER — PHENOL 1.4 % MT LIQD: 1.0000 | OROMUCOSAL | Status: DC | PRN

## 2024-01-11 MED ORDER — AMLODIPINE BESYLATE 5 MG PO TABS
5.0000 mg | ORAL_TABLET | Freq: Every day | ORAL | Status: DC
  Administered 2024-01-12: 5 mg via ORAL
  Filled 2024-01-11: qty 1

## 2024-01-11 MED ORDER — MIDAZOLAM HCL 2 MG/2ML IJ SOLN
INTRAMUSCULAR | Status: DC | PRN
  Administered 2024-01-11: 2 mg via INTRAVENOUS

## 2024-01-11 MED ORDER — DEXAMETHASONE SODIUM PHOSPHATE 10 MG/ML IJ SOLN
INTRAMUSCULAR | Status: DC | PRN
  Administered 2024-01-11: 10 mg via INTRAVENOUS

## 2024-01-11 MED ORDER — STERILE WATER FOR IRRIGATION IR SOLN
Status: DC | PRN
  Administered 2024-01-11: 2000 mL

## 2024-01-11 MED ORDER — ACETAMINOPHEN 10 MG/ML IV SOLN
1000.0000 mg | INTRAVENOUS | Status: AC
  Administered 2024-01-11: 1000 mg via INTRAVENOUS
  Filled 2024-01-11: qty 100

## 2024-01-11 MED ORDER — FENTANYL CITRATE PF 50 MCG/ML IJ SOSY
50.0000 ug | PREFILLED_SYRINGE | INTRAMUSCULAR | Status: DC
  Administered 2024-01-11: 100 ug via INTRAVENOUS
  Filled 2024-01-11: qty 2

## 2024-01-11 MED ORDER — BUPIVACAINE LIPOSOME 1.3 % IJ SUSP
INTRAMUSCULAR | Status: AC
  Filled 2024-01-11: qty 20

## 2024-01-11 MED ORDER — SODIUM CHLORIDE 0.9 % IR SOLN
Status: DC | PRN
  Administered 2024-01-11: 1000 mL
  Administered 2024-01-11: 250 mL

## 2024-01-11 MED ORDER — OXYCODONE HCL 5 MG PO TABS
ORAL_TABLET | ORAL | Status: AC
  Filled 2024-01-11: qty 1

## 2024-01-11 MED ORDER — HYDROMORPHONE HCL 2 MG/ML IJ SOLN
INTRAMUSCULAR | Status: AC
  Filled 2024-01-11: qty 1

## 2024-01-11 MED ORDER — SODIUM CHLORIDE (PF) 0.9 % IJ SOLN
INTRAMUSCULAR | Status: AC
  Filled 2024-01-11: qty 40

## 2024-01-11 MED ORDER — METOCLOPRAMIDE HCL 5 MG PO TABS: 5.0000 mg | ORAL_TABLET | Freq: Three times a day (TID) | ORAL | Status: DC | PRN

## 2024-01-11 MED ORDER — DEXMEDETOMIDINE HCL IN NACL 80 MCG/20ML IV SOLN
INTRAVENOUS | Status: DC | PRN
  Administered 2024-01-11 (×2): 12 ug via INTRAVENOUS

## 2024-01-11 MED ORDER — LIDOCAINE HCL (PF) 2 % IJ SOLN
INTRAMUSCULAR | Status: AC
  Filled 2024-01-11: qty 5

## 2024-01-11 MED ORDER — HYDROMORPHONE HCL 1 MG/ML IJ SOLN
0.5000 mg | INTRAMUSCULAR | Status: DC | PRN
  Administered 2024-01-11: 0.5 mg via INTRAVENOUS
  Filled 2024-01-11: qty 1

## 2024-01-11 MED ORDER — PROPOFOL 10 MG/ML IV BOLUS
INTRAVENOUS | Status: AC
  Filled 2024-01-11: qty 20

## 2024-01-11 MED ORDER — FENTANYL CITRATE PF 50 MCG/ML IJ SOSY
PREFILLED_SYRINGE | INTRAMUSCULAR | Status: AC
Start: 2024-01-11 — End: 2024-01-11
  Filled 2024-01-11: qty 3

## 2024-01-11 MED ORDER — BUPIVACAINE-EPINEPHRINE (PF) 0.5% -1:200000 IJ SOLN
INTRAMUSCULAR | Status: DC | PRN
  Administered 2024-01-11: 20 mL via PERINEURAL

## 2024-01-11 MED ORDER — METHOCARBAMOL 1000 MG/10ML IJ SOLN
500.0000 mg | Freq: Four times a day (QID) | INTRAMUSCULAR | Status: DC | PRN
Start: 2024-01-11 — End: 2024-01-12

## 2024-01-11 MED ORDER — METOCLOPRAMIDE HCL 5 MG/ML IJ SOLN: 5.0000 mg | Freq: Three times a day (TID) | INTRAMUSCULAR | Status: DC | PRN

## 2024-01-11 MED ORDER — DEXMEDETOMIDINE HCL IN NACL 80 MCG/20ML IV SOLN
INTRAVENOUS | Status: AC
  Filled 2024-01-11: qty 20

## 2024-01-11 MED ORDER — ONDANSETRON HCL 4 MG/2ML IJ SOLN
INTRAMUSCULAR | Status: AC
  Filled 2024-01-11: qty 2

## 2024-01-11 MED ORDER — OXYCODONE HCL 5 MG PO TABS
5.0000 mg | ORAL_TABLET | Freq: Once | ORAL | Status: AC | PRN
  Administered 2024-01-11: 5 mg via ORAL

## 2024-01-11 MED ORDER — ONDANSETRON HCL 4 MG/2ML IJ SOLN: 4.0000 mg | Freq: Once | INTRAMUSCULAR | Status: DC | PRN

## 2024-01-11 MED ORDER — MAGNESIUM CITRATE PO SOLN: 1.0000 | Freq: Once | ORAL | Status: DC | PRN

## 2024-01-11 MED ORDER — ONDANSETRON HCL 4 MG/2ML IJ SOLN
INTRAMUSCULAR | Status: DC | PRN
Start: 2024-01-11 — End: 2024-01-11
  Administered 2024-01-11: 4 mg via INTRAVENOUS

## 2024-01-11 MED ORDER — BUPIVACAINE-EPINEPHRINE 0.25% -1:200000 IJ SOLN
INTRAMUSCULAR | Status: DC | PRN
  Administered 2024-01-11: 15 mL

## 2024-01-11 MED ORDER — BUPIVACAINE IN DEXTROSE 0.75-8.25 % IT SOLN
INTRATHECAL | Status: DC | PRN
  Administered 2024-01-11: 1.8 mL via INTRATHECAL

## 2024-01-11 MED ORDER — ALUM & MAG HYDROXIDE-SIMETH 200-200-20 MG/5ML PO SUSP: 30.0000 mL | ORAL | Status: DC | PRN

## 2024-01-11 MED ORDER — MIDAZOLAM HCL 2 MG/2ML IJ SOLN
INTRAMUSCULAR | Status: AC
  Filled 2024-01-11: qty 2

## 2024-01-11 SURGICAL SUPPLY — 59 items
ATTUNE PSFEM RTSZ5 NARCEM KNEE (Femur) IMPLANT
ATTUNE PSRP INSR SZ5 6 KNEE (Insert) IMPLANT
BAG COUNTER SPONGE SURGICOUNT (BAG) IMPLANT
BAG ZIPLOCK 12X15 (MISCELLANEOUS) IMPLANT
BASE TIBIAL ROT PLAT SZ 5 KNEE (Knees) IMPLANT
BLADE SAW SGTL 11.0X1.19X90.0M (BLADE) ×2 IMPLANT
BLADE SAW SGTL 13.0X1.19X90.0M (BLADE) ×2 IMPLANT
BLADE SURG SZ10 CARB STEEL (BLADE) ×4 IMPLANT
BNDG ELASTIC 4INX 5YD STR LF (GAUZE/BANDAGES/DRESSINGS) ×2 IMPLANT
BNDG ELASTIC 6INX 5YD STR LF (GAUZE/BANDAGES/DRESSINGS) ×2 IMPLANT
BOWL SMART MIX CTS (DISPOSABLE) ×2 IMPLANT
CEMENT HV SMART SET (Cement) ×4 IMPLANT
COVER SURGICAL LIGHT HANDLE (MISCELLANEOUS) ×2 IMPLANT
CUFF TRNQT CYL 34X4.125X (TOURNIQUET CUFF) ×2 IMPLANT
DRAPE INCISE IOBAN 66X45 STRL (DRAPES) IMPLANT
DRAPE SHEET LG 3/4 BI-LAMINATE (DRAPES) ×2 IMPLANT
DRAPE SURG ORHT 6 SPLT 77X108 (DRAPES) ×4 IMPLANT
DRAPE TOP 10253 STERILE (DRAPES) ×2 IMPLANT
DRAPE U-SHAPE 47X51 STRL (DRAPES) ×2 IMPLANT
DRSG AQUACEL AG ADV 3.5X10 (GAUZE/BANDAGES/DRESSINGS) ×2 IMPLANT
DURAPREP 26ML APPLICATOR (WOUND CARE) ×2 IMPLANT
ELECT BLADE TIP CTD 4 INCH (ELECTRODE) ×2 IMPLANT
ELECT PENCIL ROCKER SW 15FT (MISCELLANEOUS) ×2 IMPLANT
ELECT REM PT RETURN 15FT ADLT (MISCELLANEOUS) ×2 IMPLANT
EVACUATOR 1/8 PVC DRAIN (DRAIN) IMPLANT
GLOVE BIO SURGEON STRL SZ7 (GLOVE) ×2 IMPLANT
GLOVE BIOGEL PI IND STRL 7.0 (GLOVE) ×2 IMPLANT
GLOVE BIOGEL PI IND STRL 8 (GLOVE) ×2 IMPLANT
GLOVE SURG SS PI 8.0 STRL IVOR (GLOVE) ×4 IMPLANT
GOWN STRL REUS W/ TWL XL LVL3 (GOWN DISPOSABLE) ×4 IMPLANT
HEMOSTAT SPONGE AVITENE ULTRA (HEMOSTASIS) IMPLANT
HOLDER FOLEY CATH W/STRAP (MISCELLANEOUS) ×2 IMPLANT
IMMOBILIZER KNEE 20 (SOFTGOODS) ×1 IMPLANT
IMMOBILIZER KNEE 20 THIGH 36 (SOFTGOODS) ×2 IMPLANT
KIT TURNOVER KIT A (KITS) ×2 IMPLANT
MANIFOLD NEPTUNE II (INSTRUMENTS) ×2 IMPLANT
NS IRRIG 1000ML POUR BTL (IV SOLUTION) IMPLANT
PACK TOTAL KNEE CUSTOM (KITS) ×2 IMPLANT
PATELLA MEDIAL ATTUN 35MM KNEE (Knees) IMPLANT
PIN STEINMAN FIXATION KNEE (PIN) IMPLANT
PROTECTOR NERVE ULNAR (MISCELLANEOUS) ×2 IMPLANT
SAW OSC TIP CART 19.5X105X1.3 (SAW) IMPLANT
SEALER BIPOLAR AQUA 6.0 (INSTRUMENTS) IMPLANT
SET HNDPC FAN SPRY TIP SCT (DISPOSABLE) ×2 IMPLANT
SOLUTION PRONTOSAN WOUND 350ML (IRRIGATION / IRRIGATOR) ×2 IMPLANT
SPIKE FLUID TRANSFER (MISCELLANEOUS) ×2 IMPLANT
STAPLER VISISTAT (STAPLE) IMPLANT
STRIP CLOSURE SKIN 1/2X4 (GAUZE/BANDAGES/DRESSINGS) IMPLANT
SUT BONE WAX W31G (SUTURE) ×2 IMPLANT
SUT MNCRL AB 4-0 PS2 18 (SUTURE) IMPLANT
SUT VIC AB 1 CT1 27XBRD ANTBC (SUTURE) ×6 IMPLANT
SUT VIC AB 2-0 CT1 TAPERPNT 27 (SUTURE) ×6 IMPLANT
SUTURE STRATFX 0 PDS 27 VIOLET (SUTURE) ×2 IMPLANT
TRAY FOLEY MTR SLVR 14FR STAT (SET/KITS/TRAYS/PACK) IMPLANT
TRAY FOLEY MTR SLVR 16FR STAT (SET/KITS/TRAYS/PACK) ×2 IMPLANT
TUBE SUCTION HIGH CAP CLEAR NV (SUCTIONS) ×2 IMPLANT
WATER STERILE IRR 1000ML POUR (IV SOLUTION) ×2 IMPLANT
WIPE CHG 2% PREP (PERSONAL CARE ITEMS) ×2 IMPLANT
WRAP KNEE MAXI GEL POST OP (GAUZE/BANDAGES/DRESSINGS) ×2 IMPLANT

## 2024-01-11 NOTE — Discharge Instructions (Signed)

## 2024-01-11 NOTE — Anesthesia Procedure Notes (Signed)
 Anesthesia Regional Block: Adductor canal block   Pre-Anesthetic Checklist: , timeout performed,  Correct Patient, Correct Site, Correct Laterality,  Correct Procedure, Correct Position, site marked,  Risks and benefits discussed,  Surgical consent,  Pre-op evaluation,  At surgeon's request and post-op pain management  Laterality: Right  Prep: chloraprep       Needles:  Injection technique: Single-shot  Needle Type: Echogenic Stimulator Needle     Needle Length: 5cm  Needle Gauge: 22     Additional Needles:   Procedures:,,,, ultrasound used (permanent image in chart),,    Narrative:  Start time: 01/11/2024 12:30 PM End time: 01/11/2024 12:35 PM Injection made incrementally with aspirations every 5 mL.  Performed by: Personally  Anesthesiologist: Rhenda Cedars, MD  Additional Notes: Functioning IV was confirmed and monitors were applied.  A 50mm 22ga Arrow echogenic stimulator needle was used. Sterile prep and drape,hand hygiene and sterile gloves were used. Ultrasound guidance: relevant anatomy identified, needle position confirmed, local anesthetic spread visualized around nerve(s)., vascular puncture avoided.  Image printed for medical record. Negative aspiration and negative test dose prior to incremental administration of local anesthetic. The patient tolerated the procedure well.

## 2024-01-11 NOTE — Anesthesia Procedure Notes (Addendum)
 Spinal  Patient location during procedure: OR Start time: 01/11/2024 12:54 PM End time: 01/11/2024 12:58 PM Reason for block: surgical anesthesia Staffing Anesthesiologist: Rhenda Cedars, MD Performed by: Rhenda Cedars, MD Authorized by: Rhenda Cedars, MD   Preanesthetic Checklist Completed: patient identified, IV checked, site marked, risks and benefits discussed, surgical consent, monitors and equipment checked, pre-op evaluation and timeout performed Spinal Block Patient position: sitting Prep: DuraPrep Patient monitoring: heart rate, cardiac monitor, continuous pulse ox and blood pressure Approach: midline Location: L5-S1 Injection technique: single-shot Needle Needle type: Whitacre  Needle gauge: 22 G Needle length: 12.7 cm Assessment Sensory level: T4 Events: CSF return Additional Notes Multiple attempts at Lumbar spine. Change to modified Taylor approach on Right. +CSF/ DOSE/+CSF

## 2024-01-11 NOTE — Anesthesia Postprocedure Evaluation (Signed)
 Anesthesia Post Note  Patient: Tara Baker  Procedure(s) Performed: ARTHROPLASTY, KNEE, TOTAL (Right: Knee)     Patient location during evaluation: PACU Anesthesia Type: Spinal Level of consciousness: oriented and awake and alert Pain management: pain level controlled Vital Signs Assessment: post-procedure vital signs reviewed and stable Respiratory status: spontaneous breathing, respiratory function stable and nonlabored ventilation Cardiovascular status: blood pressure returned to baseline and stable Postop Assessment: no headache, no backache, no apparent nausea or vomiting, spinal receding and patient able to bend at knees Anesthetic complications: no   No notable events documented.  Last Vitals:  Vitals:   01/11/24 1624 01/11/24 1630  BP:  (!) 143/88  Pulse:  (!) 124  Resp:  15  Temp:    SpO2: 97% (!) 87%    Last Pain:  Vitals:   01/11/24 1615  TempSrc:   PainSc: 0-No pain                 Cairo Lingenfelter A.

## 2024-01-11 NOTE — Interval H&P Note (Signed)
 History and Physical Interval Note:  01/11/2024 12:29 PM  Tara Baker  has presented today for surgery, with the diagnosis of Degenerative joint disease right knee.  The various methods of treatment have been discussed with the patient and family. After consideration of risks, benefits and other options for treatment, the patient has consented to  Procedure(s): ARTHROPLASTY, KNEE, TOTAL (Right) as a surgical intervention.  The patient's history has been reviewed, patient examined, no change in status, stable for surgery.  I have reviewed the patient's chart and labs.  Questions were answered to the patient's satisfaction.     Loel Ring

## 2024-01-11 NOTE — Anesthesia Preprocedure Evaluation (Addendum)
 Anesthesia Evaluation  Patient identified by MRN, date of birth, ID band Patient awake    Reviewed: Allergy & Precautions, H&P , NPO status , Patient's Chart, lab work & pertinent test results  Airway Mallampati: IV  TM Distance: <3 FB Neck ROM: Full    Dental no notable dental hx. (+) Poor Dentition, Dental Advisory Given, Missing,    Pulmonary neg pulmonary ROS, former smoker   Pulmonary exam normal breath sounds clear to auscultation       Cardiovascular Exercise Tolerance: Good hypertension, Pt. on medications negative cardio ROS Normal cardiovascular exam Rhythm:Regular Rate:Normal     Neuro/Psych negative neurological ROS  negative psych ROS   GI/Hepatic negative GI ROS, Neg liver ROS,,,  Endo/Other  negative endocrine ROS    Renal/GU negative Renal ROS  negative genitourinary   Musculoskeletal negative musculoskeletal ROS (+) Arthritis , Osteoarthritis,    Abdominal   Peds negative pediatric ROS (+)  Hematology negative hematology ROS (+)   Anesthesia Other Findings   Reproductive/Obstetrics negative OB ROS                             Anesthesia Physical Anesthesia Plan  ASA: 2  Anesthesia Plan: MAC, Regional and Spinal   Post-op Pain Management: Regional block*, Tylenol  PO (pre-op)* and Celebrex PO (pre-op)*   Induction:   PONV Risk Score and Plan: 2 and Ondansetron, Propofol infusion, Midazolam and Treatment may vary due to age or medical condition  Airway Management Planned: Natural Airway and Simple Face Mask  Additional Equipment: None  Intra-op Plan:   Post-operative Plan:   Informed Consent: I have reviewed the patients History and Physical, chart, labs and discussed the procedure including the risks, benefits and alternatives for the proposed anesthesia with the patient or authorized representative who has indicated his/her understanding and acceptance.        Plan Discussed with: Anesthesiologist  Anesthesia Plan Comments:        Anesthesia Quick Evaluation

## 2024-01-11 NOTE — Brief Op Note (Signed)
 01/11/2024  12:29 PM  PATIENT:  Tara Baker  68 y.o. female  PRE-OPERATIVE DIAGNOSIS:  Degenerative joint disease right knee  POST-OPERATIVE DIAGNOSIS:  * No post-op diagnosis entered *  PROCEDURE:  Procedure(s): ARTHROPLASTY, KNEE, TOTAL (Right)  SURGEON:  Surgeons and Role:    Orvan Blanch, MD - Primary  PHYSICIAN ASSISTANT:   ASSISTANTS: Bissell   ANESTHESIA:   spinal  EBL:  50   BLOOD ADMINISTERED:none  DRAINS: none   LOCAL MEDICATIONS USED:  MARCAINE     SPECIMEN:  No Specimen  DISPOSITION OF SPECIMEN:  N/A  COUNTS:  YES  TOURNIQUET:   DICTATION: .Other Dictation: Dictation Number [id number]  PLAN OF CARE: Admit for overnight observation  PATIENT DISPOSITION:  PACU - hemodynamically stable.   Delay start of Pharmacological VTE agent (>24hrs) due to surgical blood loss or risk of bleeding: no

## 2024-01-11 NOTE — Anesthesia Procedure Notes (Signed)
 Date/Time: 01/11/2024 12:42 PM  Performed by: Vella Gey, CRNAOxygen Delivery Method: Simple face mask

## 2024-01-11 NOTE — Transfer of Care (Signed)
 Immediate Anesthesia Transfer of Care Note  Patient: Tara Baker  Procedure(s) Performed: ARTHROPLASTY, KNEE, TOTAL (Right: Knee)  Patient Location: PACU  Anesthesia Type:Spinal  Level of Consciousness: drowsy  Airway & Oxygen Therapy: Patient Spontanous Breathing and Patient connected to face mask oxygen  Post-op Assessment: Report given to RN and Post -op Vital signs reviewed and stable  Post vital signs: Reviewed and stable  Last Vitals:  Vitals Value Taken Time  BP 91/50 01/11/24 15:20  Temp    Pulse 92 01/11/24 15:22  Resp    SpO2 88 % 01/11/24 15:22  Vitals shown include unfiled device data.  Last Pain:  Vitals:   01/11/24 1235  TempSrc:   PainSc: 0-No pain         Complications: No notable events documented.

## 2024-01-12 DIAGNOSIS — M1711 Unilateral primary osteoarthritis, right knee: Secondary | ICD-10-CM | POA: Diagnosis not present

## 2024-01-12 LAB — CBC
HCT: 39.2 % (ref 36.0–46.0)
Hemoglobin: 11.5 g/dL — ABNORMAL LOW (ref 12.0–15.0)
MCH: 28.3 pg (ref 26.0–34.0)
MCHC: 29.3 g/dL — ABNORMAL LOW (ref 30.0–36.0)
MCV: 96.3 fL (ref 80.0–100.0)
Platelets: 266 10*3/uL (ref 150–400)
RBC: 4.07 MIL/uL (ref 3.87–5.11)
RDW: 14.6 % (ref 11.5–15.5)
WBC: 20.6 10*3/uL — ABNORMAL HIGH (ref 4.0–10.5)
nRBC: 0 % (ref 0.0–0.2)

## 2024-01-12 LAB — BASIC METABOLIC PANEL WITH GFR
Anion gap: 7 (ref 5–15)
BUN: 15 mg/dL (ref 8–23)
CO2: 25 mmol/L (ref 22–32)
Calcium: 8.3 mg/dL — ABNORMAL LOW (ref 8.9–10.3)
Chloride: 99 mmol/L (ref 98–111)
Creatinine, Ser: 0.73 mg/dL (ref 0.44–1.00)
GFR, Estimated: >60 mL/min (ref >60)
Glucose, Bld: 175 mg/dL — ABNORMAL HIGH (ref 70–99)
Potassium: 5.1 mmol/L (ref 3.5–5.1)
Sodium: 131 mmol/L — ABNORMAL LOW (ref 135–145)

## 2024-01-12 NOTE — Evaluation (Signed)
 Physical Therapy Evaluation Patient Details Name: Tara Baker MRN: [id number] DOB: Dec 21, 1955 Today's Date: 01/12/2024  History of Present Illness  68  yo female S/P R TKA 01/11/24. PMH: HTN  Clinical Impression  The patient reports nausea after transfer to [facility] earlier. Patient did perform  exercises and ambulated x 40' with more nausea. RN in to give nausea meds. Patient also reports  a little   dizziness.  BP 116/ 65.  Patient has 5 Steps to practice, PT  will return   later and see if patient will  be able to practice. Pt admitted with above diagnosis.  Pt currently with functional limitations due to the deficits listed below (see PT Problem List). Pt will benefit from acute skilled PT to increase their independence and safety with mobility to allow discharge.         If plan is discharge home, recommend the following: A little help with walking and/or transfers;A little help with bathing/dressing/bathroom;Assistance with cooking/housework;Assist for transportation;Help with stairs or ramp for entrance   Can travel by private vehicle        Equipment Recommendations Rolling walker (2 wheels)  Recommendations for Other Services       Functional Status Assessment Patient has had a recent decline in their functional status and demonstrates the ability to make significant improvements in function in a reasonable and predictable amount of time.     Precautions / Restrictions Precautions Precautions: Knee;Fall Required Braces or Orthoses: Knee Immobilizer - Right Knee Immobilizer - Right: On when out of bed or walking Restrictions RLE Weight Bearing Per Provider Order: Weight bearing as tolerated      Mobility  Bed Mobility Overal bed mobility: Needs Assistance Bed Mobility: Supine to Sit, Sit to Supine     Supine to sit: Supervision Sit to supine: Min assist   General bed mobility comments: support right leg    Transfers Overall transfer level: Needs  assistance Equipment used: Rolling walker (2 wheels) Transfers: Sit to/from Stand Sit to Stand: Min assist           General transfer comment: cues for hand and right leg position    Ambulation/Gait Ambulation/Gait assistance: Min assist Gait Distance (Feet): 40 Feet Assistive device: Rolling walker (2 wheels) Gait Pattern/deviations: Step-to pattern, Antalgic       General Gait Details: cues for sequence, patient experiencing nausea  while ambulating, slightly  dizzy.  Stairs            Wheelchair Mobility     Tilt Bed    Modified Rankin (Stroke Patients Only)       Balance Overall balance assessment: Needs assistance Sitting-balance support: Feet supported, No upper extremity supported Sitting balance-Leahy Scale: Good     Standing balance support: During functional activity, Bilateral upper extremity supported Standing balance-Leahy Scale: Fair                               Pertinent Vitals/Pain Pain Assessment Pain Assessment: 0-10 Pain Score: 6  Pain Location: right knee Pain Descriptors / Indicators: Aching Pain Intervention(s): Monitored during session, Premedicated before session, Ice applied    Home Living Family/patient expects to be discharged to:: Private residence Living Arrangements: Spouse/significant other;Children Available Help at Discharge: Family;Available 24 hours/day Type of Home: Apartment Home Access: Stairs to enter Entrance Stairs-Rails: Right;Left Entrance Stairs-Number of Steps: 5 from car parking to APT level;   Home Layout: One level Home Equipment: Information systems manager  Prior Function Prior Level of Function : Independent/Modified Independent                     Extremity/Trunk Assessment   Upper Extremity Assessment Upper Extremity Assessment: Overall WFL for tasks assessed    Lower Extremity Assessment Lower Extremity Assessment: RLE deficits/detail RLE Deficits / Details: able to perform  a SLR. knee flex 0-65    Cervical / Trunk Assessment Cervical / Trunk Assessment: Normal  Communication   Communication Communication: No apparent difficulties    Cognition Arousal: Alert Behavior During Therapy: WFL for tasks assessed/performed   PT - Cognitive impairments: No apparent impairments                         Following commands: Intact       Cueing       General Comments      Exercises Total Joint Exercises Ankle Circles/Pumps: AROM, Right, 10 reps Quad Sets: AROM, Right, 10 reps Heel Slides: AAROM, Right, 10 reps Hip ABduction/ADduction: AROM, Right, 10 reps Straight Leg Raises: AAROM, Right, 10 reps Long Arc Quad: AROM, Right, 10 reps Knee Flexion: AROM, Right, 10 reps   Assessment/Plan    PT Assessment Patient needs continued PT services  PT Problem List Decreased strength;Decreased activity tolerance;Decreased mobility;Pain;Decreased range of motion       PT Treatment Interventions DME instruction;Stair training;Therapeutic activities;Gait training;Functional mobility training;Therapeutic exercise;Patient/family education    PT Goals (Current goals can be found in the Care Plan section)  Acute Rehab PT Goals Patient Stated Goal: go home PT Goal Formulation: With patient/family Time For Goal Achievement: 01/19/24 Potential to Achieve Goals: Good    Frequency 7X/week     Co-evaluation               AM-PAC PT 6 Clicks Mobility  Outcome Measure Help needed turning from your back to your side while in a flat bed without using bedrails?: A Little Help needed moving from lying on your back to sitting on the side of a flat bed without using bedrails?: A Little Help needed moving to and from a bed to a chair (including a wheelchair)?: A Little Help needed standing up from a chair using your arms (e.g., wheelchair or bedside chair)?: A Little Help needed to walk in hospital room?: A Little Help needed climbing 3-5 steps with a  railing? : A Lot 6 Click Score: 17    End of Session Equipment Utilized During Treatment: Gait belt Activity Tolerance: Treatment limited secondary to medical complications (Comment);Patient limited by pain Patient left: in bed;with call bell/phone within reach;with family/visitor present Nurse Communication: Mobility status PT Visit Diagnosis: Unsteadiness on feet (R26.81);Other abnormalities of gait and mobility (R26.89);Pain Pain - Right/Left: Right Pain - part of body: Knee    Time: 1096-0454 PT Time Calculation (min) (ACUTE ONLY): 44 min   Charges:   PT Evaluation $PT Eval Low Complexity: 1 Low PT Treatments $Gait Training: 8-22 mins $Therapeutic Exercise: 8-22 mins PT General Charges $$ ACUTE PT VISIT: 1 Visit         Abelina Hoes PT Acute Rehabilitation Services Office [phone]   Dareen Ebbing 01/12/2024, 9:52 AM

## 2024-01-12 NOTE — Progress Notes (Signed)
 Subjective: 1 Day Post-Op Procedure(s) (LRB): ARTHROPLASTY, KNEE, TOTAL (Right)  Patient reports pain as appropriately controlled. Denies any new numbness/tingling.   Objective:   VITALS:  Temp:  [97.5 F (36.4 C)-98.7 F (37.1 C)] 97.7 F (36.5 C) (06/14 0605) Pulse Rate:  [63-124] 63 (06/14 0605) Resp:  [7-18] 16 (06/14 0605) BP: (91-143)/(47-96) 111/64 (06/14 0605) SpO2:  [87 %-98 %] 95 % (06/14 0605) Weight:  [89.8 kg] 89.8 kg (06/13 1038)  Gen: AAOx3, NAD Comfortable at rest  R Lower Extremity: Dressings intact ADF/APF/EHL 5/5 SILT throughout DP, PT 2+ to palp CR < 2s    LABS Recent Labs    01/12/24 0323  HGB 11.5*  WBC 20.6*  PLT 266   Recent Labs    01/12/24 0323  NA 131*  K 5.1  CL 99  CO2 25  BUN 15  CREATININE 0.73  GLUCOSE 175*   No results for input(s): LABPT, INR in the last 72 hours.   Assessment/Plan: 1 Day Post-Op Procedure(s) (LRB): ARTHROPLASTY, KNEE, TOTAL (Right)  Advance diet Up with therapy D/C IV fluids Discharge home with home health  Ali Ink 01/12/2024, 7:44 AM

## 2024-01-12 NOTE — Progress Notes (Signed)
 Physical Therapy Evaluation Patient Details Name: Tara Baker MRN: [id number] DOB: August 02, 1955 Today's Date: 01/12/2024  History of Present Illness  68  yo female S/P R TKA 01/11/24. PMH: HTN  Clinical Impression  The patient reports feeling better. Patient and family   practiced steps, recommend right rail use and HHA on left and rec. That patient go ahead and get a cane so  she can use it on the steps.  Patient has met PT goals for Dc.          If plan is discharge home, recommend the following: A little help with walking and/or transfers;A little help with bathing/dressing/bathroom;Assistance with cooking/housework;Assist for transportation;Help with stairs or ramp for entrance   Can travel by private vehicle        Equipment Recommendations Rolling walker (2 wheels)  Recommendations for Other Services       Functional Status Assessment Patient has had a recent decline in their functional status and demonstrates the ability to make significant improvements in function in a reasonable and predictable amount of time.     Precautions / Restrictions Precautions Precautions: Knee;Fall Required Braces or Orthoses: Knee Immobilizer - Right Knee Immobilizer - Right: On when out of bed or walking Restrictions RLE Weight Bearing Per Provider Order: Weight bearing as tolerated      Mobility  Bed Mobility Overal bed mobility: Needs Assistance Bed Mobility: Supine to Sit     Supine to sit: Supervision Sit to supine: Min assist   General bed mobility comments: support right leg    Transfers Overall transfer level: Needs assistance Equipment used: Rolling walker (2 wheels) Transfers: Sit to/from Stand Sit to Stand: Min assist           General transfer comment: cues for hand and right leg position    Ambulation/Gait Ambulation/Gait assistance: Min assist Gait Distance (Feet): 50 Feet Assistive device: Rolling walker (2 wheels) Gait Pattern/deviations: Step-to  pattern, Antalgic       General Gait Details: cues for sequence,  Stairs Stairs: Yes Stairs assistance: Min assist Stair Management: One rail Right, Forwards Number of Stairs: 2 (then 3) General stair comments: spouse and daughter present, atempted on right rail sideways but patient needed 2 sides for support so performed right rail and HHA on left  Wheelchair Mobility     Tilt Bed    Modified Rankin (Stroke Patients Only)       Balance Overall balance assessment: Needs assistance Sitting-balance support: Feet supported, No upper extremity supported Sitting balance-Leahy Scale: Good     Standing balance support: During functional activity, Bilateral upper extremity supported Standing balance-Leahy Scale: Fair                               Pertinent Vitals/Pain Pain Assessment Pain Assessment: 0-10 Pain Score: 4  Pain Location: right knee Pain Descriptors / Indicators: Aching Pain Intervention(s): Premedicated before session    Home Living Family/patient expects to be discharged to:: Private residence Living Arrangements: Spouse/significant other;Children Available Help at Discharge: Family;Available 24 hours/day Type of Home: Apartment Home Access: Stairs to enter Entrance Stairs-Rails: Right;Left Entrance Stairs-Number of Steps: 5 from car parking to APT level;   Home Layout: One level Home Equipment: Shower seat      Prior Function Prior Level of Function : Independent/Modified Independent                     Extremity/Trunk Assessment  Upper Extremity Assessment Upper Extremity Assessment: Overall WFL for tasks assessed    Lower Extremity Assessment Lower Extremity Assessment: RLE deficits/detail RLE Deficits / Details: able to perform a SLR. knee flex 0-65    Cervical / Trunk Assessment Cervical / Trunk Assessment: Normal  Communication   Communication Communication: No apparent difficulties    Cognition Arousal:  Alert Behavior During Therapy: WFL for tasks assessed/performed   PT - Cognitive impairments: No apparent impairments                         Following commands: Intact       Cueing       General Comments      Exercises    Assessment/Plan    PT Assessment Patient needs continued PT services  PT Problem List Decreased strength;Decreased activity tolerance;Decreased mobility;Pain;Decreased range of motion       PT Treatment Interventions DME instruction;Stair training;Therapeutic activities;Gait training;Functional mobility training;Therapeutic exercise;Patient/family education    PT Goals (Current goals can be found in the Care Plan section)  Acute Rehab PT Goals Patient Stated Goal: go home PT Goal Formulation: With patient/family Time For Goal Achievement: 01/19/24 Potential to Achieve Goals: Good    Frequency 7X/week     Co-evaluation               AM-PAC PT 6 Clicks Mobility  Outcome Measure Help needed turning from your back to your side while in a flat bed without using bedrails?: A Little Help needed moving from lying on your back to sitting on the side of a flat bed without using bedrails?: A Little Help needed moving to and from a bed to a chair (including a wheelchair)?: A Little Help needed standing up from a chair using your arms (e.g., wheelchair or bedside chair)?: A Little Help needed to walk in hospital room?: A Little Help needed climbing 3-5 steps with a railing? : A Little 6 Click Score: 18    End of Session Equipment Utilized During Treatment: Gait belt Activity Tolerance: Patient tolerated treatment well Patient left:  (in BR with spouse, dtr in room.) Nurse Communication: Mobility status PT Visit Diagnosis: Unsteadiness on feet (R26.81);Other abnormalities of gait and mobility (R26.89);Pain Pain - Right/Left: Right Pain - part of body: Knee    Time: 1052-1110 PT Time Calculation (min) (ACUTE ONLY): 18  min   Charges:   PT Evaluation $PT Eval Low Complexity: 1 Low PT Treatments $Gait Training: 8-22 mins $Therapeutic Exercise: 8-22 mins PT General Charges $$ ACUTE PT VISIT: 1 Visit         Abelina Hoes PT Acute Rehabilitation Services Office [phone]   Dareen Ebbing 01/12/2024, 12:55 PM

## 2024-01-12 NOTE — Op Note (Unsigned)
 NAMEMEIGAN, Tara Baker MEDICAL RECORD NO: [id number] ACCOUNT NO: [id number] DATE OF BIRTH: 12-20-1955 FACILITY: [facility] LOCATION: [id number] PHYSICIAN: Loel Ring, MD  Operative Report   DATE OF PROCEDURE: 01/11/2024  PREOPERATIVE DIAGNOSES: 1.  End-stage osteoarthrosis, right knee.  2.  Valgus deformity.  POSTOPERATIVE DIAGNOSES: 1.  End-stage osteoarthrosis, right knee.  2.  Valgus deformity.  PROCEDURES PERFORMED:  Right total knee arthroplasty utilizing the Depuy Attune rotating platform 5 femur, 5 tibia, 6 mm insert, 35 patella.  ANESTHESIA:  Spinal.  ASSISTANT:  Jaclyn Bissell, PA.  HISTORY:  This is a 68 year old female with end-stage osteoarthrosis, right knee,refractory to conservative treatment, indicated for replacement of the degenerated joint.  She had bone-on-bone arthrosis lateral compartment.  Risks and benefits were  discussed including bleeding, infection, damage to neurovascular structures, neurogenic symptoms, worsening symptoms, DVT, PE, anesthetic complications, etc.  DESCRIPTION OF PROCEDURE:  The patient was placed in supine position.  After induction of adequate spinal anesthesia, and 2 g of Kefzol, the right lower extremity was prepped, draped, and exsanguinated in the usual sterile fashion.  The thigh tourniquet  inflated to 250 mmHg.   A midline incision was then made over the knee.  Full-thickness flaps were developed.  The medial parapatellar arthrotomy was then performed.  Elevated soft tissues medially, preserving the MCL, gently everted the patella.  The knee was flexed.   Tricompartmental osteoarthrosis was noted, particularly posterolaterally in the medial femoral condyle.  The notch was placed above the femoral notch as a starting hole for the femoral drill after ACL and medial and lateral menisci were excised.  This  was drilled in line with the femur, entering the canal with the T-handle, irrigating it, and then using an intramedullary guide with  5-degree right aid off the distal femur due to the patient's slight hyperextension.  This was pinned and I performed a  distal femoral condyle and incised the femur off the anterior cortex to be a 5 and 3 degrees of external rotation.  This was pinned, placed a block, performed anterior, posterior, and chamfer cuts.  Soft tissue was protected posteriorly at all times.   Subluxed the tibia, low side was 2 off the defect posterolaterally.  Excellent alignment guide, biceps and tibiotalar joint parallel to the shaft, 3-degree slope.  This was pinned.  I performed our tibial cut.  Then I used a 6 mm insert in full extension  with a stable extension gap.  I reflexed the knee, subluxed the tibia, size the tibia to a 5, maximizing coverage just to the medial third of the tibial tubercle, pinned, harvested bone centrally and impacted in the distal femur.  Drilled centrally,  used a punch guide.  Attention back to the femur where I placed a box cut guide, bisected the canal on the condyles.  A box cut was then performed, removed the jig, and inserted the trial femur, which fit flush.  Drilled our lug holes.  Placed a 6 mm  insert, reduced the knee at full extension, full flexion, good stability with varus and valgus stressing at 0 and 30 degrees.  Negative anterior drawer.  Following this, everted the patella, measured to a 25, plane to a 16, utilizing a patellar jig.   Then used a 35 paddle parallel to the joint surface, drilling our peg holes, placed a trial of patella, reduced it, and had slight lateral tracking of the patella.  Lateral release was therefore performed and I had excellent patellofemoral tracking.  All  instrumentation  was then removed.  We checked posteriorly.  The popliteus and capsule was intact.  We anesthetized the posteromedial capsule through the capsule with Exparel after injecting 10 mL prior to that aspirating without a break in the vacuum.   Anesthetized the medial and lateral gutters,  periosteum of the femur, quadriceps, proximal tibia, etc.  Pulsatile lavage, thoroughly cleaning all surfaces.  Knee was flexed.  All surfaces thoroughly dried.  Mixed cement on the back table.  Under vacuum,  we placed cement in the proximal tibia, digitally pressurizing after drill holes placed in the tibia.  Cement was placed on the tibial component as well as the proximal tibia digitally pressurizing it.  Impacted into place.  Redundant cement removed.   Cement was placed in the femoral component in the femur and impacted it into the end of the femur.  Redundant cement removed.  It fit flush.  Six insert was placed, reduced it and held an axial load throughout the curing of the cement with the knee in  extension.  I cemented and clamped the patella.  Marcaine with epinephrine followed by Prontosan was placed in the wound and the wound was covered during the curing of the cement.  Tourniquet was deflated at 51 mm.  Bleeding was cauterized, which was  mild.  This was the Aquamantys.  Next, I removed the trial.  Meticulously removed all redundant cement.  Copiously irrigated with pulsatile lavage followed by Prontosan and then placed a permanent polyethylene insert 6 mm.  Reduced it.  I had full extension, full flexion.  Good  stability to varus and valgus stressing at 0 to 30 degrees.  Negative anterior drawer.  Excellent patellofemoral tracking.  Mid flexion was then reapproximated with patellar arthrotomy with #1 Vicryl interrupted figure-of-eight sutures and then oversewn  with a running StrataFix.  Excellent patellofemoral tracking followed.  Irrigated subcu with Prontosan and subcutaneous was closed with 2-0 and skin with subcuticular Monocryl.  Sterile dressing applied, placed in an immobilizer and transported to the  recovery room in satisfactory condition.  The patient tolerated the procedure well and there were no complications.   Assistant, Jaclyn Bissell, [place], was used throughout the case for  patient positioning, intermittent traction, exposure and closure.  BLOOD LOSS:  50 mL.   MUK D: 01/11/2024 2:51:05 pm T: 01/12/2024 12:28:00 am  JOB: [id number]/ [id number]

## 2024-01-12 NOTE — Progress Notes (Signed)
   01/12/24 1122  TOC Brief Assessment  Insurance and Status Reviewed  Patient has primary care physician Yes  Home environment has been reviewed home w/ family  Prior level of function: mod independent  Prior/Current Home Services No current home services  Social Drivers of Health Review SDOH reviewed no interventions necessary  Readmission risk has been reviewed Yes  Transition of care needs no transition of care needs at this time   RW ordered through [facility]

## 2024-01-14 ENCOUNTER — Encounter (HOSPITAL_COMMUNITY): Payer: Self-pay | Admitting: Specialist

## 2024-01-15 ENCOUNTER — Encounter (HOSPITAL_COMMUNITY): Payer: Self-pay | Admitting: Specialist

## 2024-01-15 NOTE — Addendum Note (Signed)
 Addendum  created 01/15/24 1811 by Rhenda Cedars, MD   Clinical Note Signed, Intraprocedure Blocks edited

## 2024-08-21 ENCOUNTER — Other Ambulatory Visit: Payer: Self-pay

## 2024-08-22 ENCOUNTER — Other Ambulatory Visit

## 2024-08-22 ENCOUNTER — Telehealth: Payer: Self-pay | Admitting: Nurse Practitioner

## 2024-08-22 ENCOUNTER — Encounter: Payer: Self-pay | Admitting: Nurse Practitioner

## 2024-08-22 ENCOUNTER — Ambulatory Visit: Admitting: Nurse Practitioner

## 2024-08-22 VITALS — BP 118/72 | HR 86 | Ht 63.0 in | Wt 193.1 lb

## 2024-08-22 DIAGNOSIS — Z860109 Personal history of other colon polyps: Secondary | ICD-10-CM | POA: Diagnosis not present

## 2024-08-22 DIAGNOSIS — A498 Other bacterial infections of unspecified site: Secondary | ICD-10-CM

## 2024-08-22 LAB — CBC WITH DIFFERENTIAL/PLATELET
Basophils Absolute: 0.1 K/uL (ref 0.0–0.1)
Basophils Relative: 0.5 % (ref 0.0–3.0)
Eosinophils Absolute: 0.7 K/uL (ref 0.0–0.7)
Eosinophils Relative: 6.1 % — ABNORMAL HIGH (ref 0.0–5.0)
HCT: 39.2 % (ref 36.0–46.0)
Hemoglobin: 13.1 g/dL (ref 12.0–15.0)
Lymphocytes Relative: 21.7 % (ref 12.0–46.0)
Lymphs Abs: 2.4 K/uL (ref 0.7–4.0)
MCHC: 33.4 g/dL (ref 30.0–36.0)
MCV: 89.4 fl (ref 78.0–100.0)
Monocytes Absolute: 0.9 K/uL (ref 0.1–1.0)
Monocytes Relative: 8.4 % (ref 3.0–12.0)
Neutro Abs: 7 K/uL (ref 1.4–7.7)
Neutrophils Relative %: 63.3 % (ref 43.0–77.0)
Platelets: 295 K/uL (ref 150.0–400.0)
RBC: 4.38 Mil/uL (ref 3.87–5.11)
RDW: 14.5 % (ref 11.5–15.5)
WBC: 11 K/uL — ABNORMAL HIGH (ref 4.0–10.5)

## 2024-08-22 LAB — COMPREHENSIVE METABOLIC PANEL WITH GFR
ALT: 27 U/L (ref 3–35)
AST: 22 U/L (ref 5–37)
Albumin: 4.3 g/dL (ref 3.5–5.2)
Alkaline Phosphatase: 53 U/L (ref 39–117)
BUN: 10 mg/dL (ref 6–23)
CO2: 32 meq/L (ref 19–32)
Calcium: 9.2 mg/dL (ref 8.4–10.5)
Chloride: 104 meq/L (ref 96–112)
Creatinine, Ser: 0.76 mg/dL (ref 0.40–1.20)
GFR: 80.21 mL/min
Glucose, Bld: 97 mg/dL (ref 70–99)
Potassium: 4.5 meq/L (ref 3.5–5.1)
Sodium: 142 meq/L (ref 135–145)
Total Bilirubin: 0.3 mg/dL (ref 0.2–1.2)
Total Protein: 7.3 g/dL (ref 6.0–8.3)

## 2024-08-22 MED ORDER — FIDAXOMICIN 200 MG PO TABS: 200.0000 mg | ORAL_TABLET | Freq: Two times a day (BID) | ORAL | 0 refills | Status: AC

## 2024-08-22 NOTE — Telephone Encounter (Signed)
 PT cannot afford the medication at the [facility]. [facility] to [facility] [address]

## 2024-08-22 NOTE — Progress Notes (Signed)
 Agree with assessment and plan as outlined.  Lets see how she does with a course of Dificid .  If she has recurrence following this then we may need to consider a course of Vowst

## 2024-08-22 NOTE — Patient Instructions (Addendum)
 _______________________________________________________  If your blood pressure at your visit was 140/90 or greater, please contact your primary care physician to follow up on this.  _______________________________________________________  If you are age 69 or older, your body mass index should be between 23-30. Your Body mass index is 34.21 kg/m. If this is out of the aforementioned range listed, please consider follow up with your Primary Care Provider.  If you are age 63 or younger, your body mass index should be between 19-25. Your Body mass index is 34.21 kg/m. If this is out of the aformentioned range listed, please consider follow up with your Primary Care Provider.   ________________________________________________________  The [facility] providers would like to encourage you to use MYCHART to communicate with providers for non-urgent requests or questions.  Due to long hold times on the telephone, sending your provider a message by [facility] may be a faster and more efficient way to get a response.  Please allow 48 business hours for a response.  Please remember that this is for non-urgent requests.  _______________________________________________________  [facility] is using a team-based approach to care.  Your team is made up of your doctor and two to three APPS. Our APPS (Nurse Practitioners and Physician Assistants) work with your physician to ensure care continuity for you. They are fully qualified to address your health concerns and develop a treatment plan. They communicate directly with your gastroenterologist to care for you. Seeing the Advanced Practice Practitioners on your physician's team can help you by facilitating care more promptly, often allowing for earlier appointments, access to diagnostic testing, procedures, and other specialty referrals.   Your provider has requested that you go to the basement level for lab work before leaving today. Press B on the  elevator. The lab is located at the first door on the left as you exit the elevator.  We have sent the following medications to your pharmacy for you to pick up at your convenience:  START: Dificid 200mg  one tablet two times daily for 10 days.  Will require prior authorization.  Please purchase the following medications over the counter and take as directed:  USE Florastor probiotic one by mouth two times daily for 4 weeks.   Push plenty of fluids.  No Dairy.  Due to recent changes in healthcare laws, you may see the results of your imaging and laboratory studies on MyChart before your provider has had a chance to review them.  We understand that in some cases there may be results that are confusing or concerning to you. Not all laboratory results come back in the same time frame and the provider may be waiting for multiple results in order to interpret others.  Please give us  48 hours in order for your provider to thoroughly review all the results before contacting the office for clarification of your results.   Thank you for entrusting me with your care and choosing [facility].  Elida Shawl, NP

## 2024-08-22 NOTE — Progress Notes (Signed)
 "    08/22/2024 Tara Baker [id number] 08/13/1955  PRIMARY GASTROENTEROLOGIST: Dr. Leigh    CHIEF COMPLAINT: Recurrent C. difficile  HISTORY OF PRESENT ILLNESS: Tara Baker. Page is a 69 year old female with a past medical history of arthritis, hyperlipidemia, hypertension, CKD stage II, vertigo and colon polyps.  Status post right knee total replacement surgery 12/2023.   Discussed the use of AI scribe software for clinical note transcription with the Tara Baker, who gave verbal consent to proceed.  History of Present Illness Tara Baker is a 69 year old female with recurrent Clostridioides difficile infection who presents for evaluation of persistent diarrhea.  Initial diagnosis of Clostridioides difficile infection occurred in November 2025 following multiple antibiotic exposures, including for right knee replacement in June 2025, a dental procedure, and bronchitis ( Z-pak)  in October 2025. Three courses of oral vancomycin were completed, each dosed four times daily, with the third course starting July 22, 2024 and ending the Sunday 08/03/2024.  Tara Baker stated her diarrhea and intermittent lower abdominal pain did not improved following the 3 courses of vancomycin.  No solid stools since early November.  Tara Baker initially developed diarrhea in October after Tara Baker took a Z-Pak for bronchitis.  Endorsed passing up to 9 loose stools daily.  C. difficile toxin A and B positive and C. diff toxic gene positive  on 06/09/2024.  No prior history of C. difficile.  Tara Baker was initially prescribed Vancomycin 250mg  po 4 times daily x 14 days on 06/09/2024.  I am unable to locate the other 2 prescriptions for vancomycin in care everywhere.  However, the Tara Baker stated her last dose of vancomycin was completed 08/03/2024.  Tara Baker was apparently prescribed a prolonged course of vancomycin with taper to last 3 weeks, however, Tara Baker was not aware of the taper instructions therefore Tara Baker took vancomycin 1 capsule  4 times daily until Tara Baker completed the provided supply.  Labs 06/09/2024: WBC 11.7.  Hemoglobin 12.9.  Hematocrit 39.3.  Platelets 385.  Tara Baker continued to have persistent loose, watery stools have continued since onset, with no return to solid stool. Abdominal cramping is most prominent in the morning, accompanied by urgency and episodes of diarrhea that sometimes wake her at night. On the day prior to the visit, Tara Baker experienced at least ten episodes of diarrhea, and five episodes on the day of the visit. No blood in stool is reported, but soreness of the bottom is present. No improvement in symptoms has occurred since initial diagnosis. Tara Baker denies fevers and constipation.  Tara Baker is taking align probiotic once daily.  Her most recent colonoscopy was in 2019.  See results below.  Colonoscopy 09/21/2017: - One 3 mm polyp in the cecum, removed with a cold snare. Resected and retrieved.  - Diverticulosis in the sigmoid colon.  - The examination was otherwise normal on direct and retroflexion views. - 10 year recall  Surgical [P], cecum, polyp - POLYPOID COLONIC MUCOSA WITH NO SPECIFIC HISTOPATHOLOGIC CHANGES - MULTIPLE STEP SECTIONS WERE EXAMINED  Past Medical History:  Diagnosis Date   Arthritis    Bronchitis    Hyperlipidemia    Hypertension    Venous insufficiency    Vertigo    Past Surgical History:  Procedure Laterality Date   CATARACT EXTRACTION Bilateral    COLONOSCOPY     PARTIAL HYSTERECTOMY  1987   TOTAL KNEE ARTHROPLASTY Right 01/11/2024   Procedure: ARTHROPLASTY, KNEE, TOTAL;  Surgeon: Duwayne Purchase, MD;  Location: [facility];  Service: Orthopedics;  Laterality: Right;  TUBAL LIGATION  1981   Social History: Tara Baker is married.  Past smoker, quit in the 1990s. Rare alcohol use. No drug use.   Family History: No known family history of esophageal, gastric or colon cancer.  Allergies[1]    Outpatient Encounter Medications as of 08/22/2024  Medication Sig   albuterol (VENTOLIN HFA)  108 (90 Base) MCG/ACT inhaler 1-2 puffs as needed Inhalation every 6 hrs; Duration: 30 days   amLODipine  (NORVASC ) 5 MG tablet Take 1 tablet by mouth daily.   ascorbic acid  (VITAMIN C ) 500 MG tablet Take 500 mg by mouth daily.   aspirin  EC 81 MG tablet Take 81 mg by mouth daily.   azithromycin  (ZITHROMAX ) 500 MG tablet Take 500 mg by mouth daily.   calcium carbonate (SUPER CALCIUM) 1500 (600 Ca) MG TABS tablet 1 tablet with meals Orally Twice a day   Cholecalciferol  (VITAMIN D ) 2000 UNITS tablet Take 2,000 Units by mouth daily.   fluticasone (FLONASE) 50 MCG/ACT nasal spray 1 spray in each nostril Nasally Twice a day; Duration: 30 days   gabapentin  (NEURONTIN ) 300 MG capsule Take 300 mg by mouth at bedtime.   lactobacillus acidophilus (BACID) TABS tablet Take 2 tablets by mouth daily at 12 noon.   loratadine  (CLARITIN ) 10 MG tablet Take 1 tablet by mouth daily.   losartan  (COZAAR ) 100 MG tablet Take 1 tablet by mouth daily.   Multiple Vitamin (MULTIVITAMIN WITH MINERALS) TABS tablet Take 1 tablet by mouth daily.   pravastatin  (PRAVACHOL ) 20 MG tablet Take 1 tablet by mouth daily.   predniSONE (DELTASONE) 20 MG tablet Take 20 mg by mouth daily with breakfast.   No facility-administered encounter medications on file as of 08/22/2024.    REVIEW OF SYSTEMS:  Gen: Denies fever, sweats or chills. No weight loss.  CV: Denies chest pain, palpitations or edema. Resp: Denies cough, shortness of breath of hemoptysis.  GI: See HPI.  GU: Denies urinary burning, blood in urine, increased urinary frequency or incontinence. MS: Denies joint pain, muscles aches or weakness. Derm: Denies rash, itchiness, skin lesions or unhealing ulcers. Psych: Denies depression, anxiety, memory loss or confusion. Heme: Denies bruising, easy bleeding. Neuro:  Denies headaches, dizziness or paresthesias. Endo:  Denies any problems with DM, thyroid or adrenal function.  PHYSICAL EXAM: BP 118/72   Pulse 86   Ht 5' 3  (1.6 m)   Wt 193 lb 2 oz (87.6 kg)   BMI 34.21 kg/m   General: 69 year old female in no acute distress.  Tara Baker is not toxic-appearing. Head: Normocephalic and atraumatic. Eyes:  Sclerae non-icteric, conjunctive pink. Ears: Normal auditory acuity. Mouth: Dentition intact. No ulcers or lesions.  Neck: Supple, no lymphadenopathy or thyromegaly.  Lungs: Clear bilaterally to auscultation without wheezes, crackles or rhonchi. Heart: Regular rate and rhythm. No murmur, rub or gallop appreciated.  Abdomen: Soft, nondistended.  Mild epigastric and lower abdominal tenderness without rebound or guarding.  No masses. No hepatosplenomegaly. Normoactive bowel sounds x 4 quadrants.  Rectal: Deferred. Musculoskeletal: Symmetrical with no gross deformities. Skin: Warm and dry. No rash or lesions on visible extremities. Extremities: No edema. Neurological: Alert oriented x 4, no focal deficits.  Psychological: Alert and cooperative. Normal mood and affect.  ASSESSMENT AND PLAN:  69 year old female diagnosed with C. difficile infection, C. difficile toxin A and B positive and C. difficile toxin gene + 06/09/2024.  Received 3 courses of antibiotics from June - Oct 2025 as noted in HPI.  No prior history of C. difficile.  Prescribed  3 courses of vancomycin per PCP without improvement.  Completed last course of vancomycin 08/03/2024.  Tara Baker continues to pass 4-9 nonbloody loose stools daily with intermittent lower abdominal discomfort.  No severe abdominal pain.  No fevers. - Dificid 200 mg 1 tab p.o. twice daily x 10 days - Florastor probiotic 1 p.o. twice daily for 4 weeks - Push fluids, avoid dairy products - Tara Baker counseled guarding handwashing with soap and warm water  - Tara Baker to contact office if diarrhea or abdominal pain worsens - CBC, CMP  Colon cancer screening.  Colonoscopy 08/2017 identified one 3 mm polyp, path report consistent with polypoid colonic mucosa. - Next screening colonoscopy due  09/2027   CC:  Yolande Toribio MATSU, MD       [1] No Known Allergies  "

## 2024-08-26 ENCOUNTER — Other Ambulatory Visit (HOSPITAL_COMMUNITY): Payer: Self-pay

## 2024-08-26 NOTE — Telephone Encounter (Signed)
 Patient aware of she has been approved for patient assistance.  Patient aware that medication should be delivered to our office.  Patient will be made aware after we receive it.  She would like to be called at her office number of [phone].  Patient agreed to plan and verbalized understanding.

## 2024-08-26 NOTE — Telephone Encounter (Signed)
 Left message on voicemail to clarify [facility].  [address]?

## 2024-08-26 NOTE — Telephone Encounter (Signed)
 Returned call to the patient who stated that since she was not able to get a response from our office on 08-22-24, she contact her PCP.  PCP wrote Rx for vancomycin 250mg  QID x 2 weeks.  Patient stated,  I can not take vancomycin any longer as this will make my 4th time since November.  I was referred to your office for a fecal transplant.  I explained to the patient I would speak with Colleen, NP for next steps.    Colleen and Dr Leigh would like to try to get Dificid  approved with insurance to see if this will make the medication more affordable.    Please start prior auth for Dificid  200mg  one tablet two times daily for 10 days.

## 2024-08-26 NOTE — Telephone Encounter (Signed)
 No PA required. [employer] paperwork filled out and sent for assistance. Will update when hear back.

## 2024-08-27 ENCOUNTER — Ambulatory Visit: Payer: Self-pay | Admitting: Nurse Practitioner

## 2024-08-27 NOTE — Telephone Encounter (Signed)
 Patient aware that Dificid  arrived to the office today. It is available for pick up. Patient will pick up later today.  LOT # [id number] 02-28-2027 1 bottle #20

## 2024-09-15 ENCOUNTER — Ambulatory Visit: Admitting: Gastroenterology
